# Patient Record
Sex: Female | Born: 1950 | Race: White | Hispanic: No | Marital: Single | Smoking: Current every day smoker
Health system: Southern US, Community
[De-identification: ages and names within clinical notes are randomized; demographics above are authoritative.]

## PROBLEM LIST (undated history)

## (undated) DIAGNOSIS — G2581 Restless legs syndrome: Secondary | ICD-10-CM

## (undated) DIAGNOSIS — F32A Depression, unspecified: Secondary | ICD-10-CM

## (undated) DIAGNOSIS — F988 Other specified behavioral and emotional disorders with onset usually occurring in childhood and adolescence: Secondary | ICD-10-CM

## (undated) DIAGNOSIS — Z720 Tobacco use: Secondary | ICD-10-CM

## (undated) DIAGNOSIS — E079 Disorder of thyroid, unspecified: Secondary | ICD-10-CM

## (undated) DIAGNOSIS — F329 Major depressive disorder, single episode, unspecified: Secondary | ICD-10-CM

## (undated) DIAGNOSIS — F419 Anxiety disorder, unspecified: Secondary | ICD-10-CM

## (undated) DIAGNOSIS — I10 Essential (primary) hypertension: Secondary | ICD-10-CM

## (undated) HISTORY — PX: KNEE SURGERY: SHX244

## (undated) HISTORY — PX: WRIST SURGERY: SHX841

---

## 1998-08-27 ENCOUNTER — Emergency Department (HOSPITAL_COMMUNITY): Admission: EM | Admit: 1998-08-27 | Discharge: 1998-08-27 | Payer: Self-pay | Admitting: Emergency Medicine

## 1998-11-11 ENCOUNTER — Encounter: Payer: Self-pay | Admitting: Emergency Medicine

## 1998-11-11 ENCOUNTER — Encounter: Payer: Self-pay | Admitting: Urology

## 1998-11-11 ENCOUNTER — Ambulatory Visit (HOSPITAL_COMMUNITY): Admission: EM | Admit: 1998-11-11 | Discharge: 1998-11-12 | Payer: Self-pay | Admitting: Emergency Medicine

## 1998-11-16 ENCOUNTER — Emergency Department (HOSPITAL_COMMUNITY): Admission: EM | Admit: 1998-11-16 | Discharge: 1998-11-16 | Payer: Self-pay | Admitting: Emergency Medicine

## 1998-11-16 ENCOUNTER — Encounter: Payer: Self-pay | Admitting: Urology

## 1998-11-26 ENCOUNTER — Encounter: Payer: Self-pay | Admitting: Urology

## 1998-11-26 ENCOUNTER — Ambulatory Visit (HOSPITAL_COMMUNITY): Admission: RE | Admit: 1998-11-26 | Discharge: 1998-11-26 | Payer: Self-pay | Admitting: Urology

## 1999-09-29 ENCOUNTER — Encounter: Admission: RE | Admit: 1999-09-29 | Discharge: 1999-12-28 | Payer: Self-pay | Admitting: Anesthesiology

## 2002-09-17 ENCOUNTER — Emergency Department (HOSPITAL_COMMUNITY): Admission: EM | Admit: 2002-09-17 | Discharge: 2002-09-17 | Payer: Self-pay | Admitting: Emergency Medicine

## 2002-09-17 ENCOUNTER — Encounter: Payer: Self-pay | Admitting: Emergency Medicine

## 2006-03-23 ENCOUNTER — Encounter: Payer: Self-pay | Admitting: Emergency Medicine

## 2006-09-09 ENCOUNTER — Emergency Department (HOSPITAL_COMMUNITY): Admission: EM | Admit: 2006-09-09 | Discharge: 2006-09-09 | Payer: Self-pay | Admitting: Emergency Medicine

## 2009-08-28 ENCOUNTER — Inpatient Hospital Stay (HOSPITAL_COMMUNITY): Admission: EM | Admit: 2009-08-28 | Discharge: 2009-08-30 | Payer: Self-pay | Admitting: Emergency Medicine

## 2009-09-11 ENCOUNTER — Encounter: Admission: RE | Admit: 2009-09-11 | Discharge: 2009-11-12 | Payer: Self-pay | Admitting: Orthopedic Surgery

## 2009-10-09 ENCOUNTER — Ambulatory Visit (HOSPITAL_BASED_OUTPATIENT_CLINIC_OR_DEPARTMENT_OTHER): Admission: RE | Admit: 2009-10-09 | Discharge: 2009-10-09 | Payer: Self-pay | Admitting: Orthopedic Surgery

## 2011-02-24 LAB — POCT HEMOGLOBIN-HEMACUE: Hemoglobin: 15.9 g/dL — ABNORMAL HIGH (ref 12.0–15.0)

## 2011-02-25 LAB — POCT I-STAT, CHEM 8
Calcium, Ion: 1.13 mmol/L (ref 1.12–1.32)
Glucose, Bld: 96 mg/dL (ref 70–99)
HCT: 41 % (ref 36.0–46.0)
Hemoglobin: 13.9 g/dL (ref 12.0–15.0)
Potassium: 4 mEq/L (ref 3.5–5.1)
Sodium: 141 mEq/L (ref 135–145)

## 2011-02-25 LAB — CBC
HCT: 38.4 % (ref 36.0–46.0)
Hemoglobin: 13.2 g/dL (ref 12.0–15.0)
MCV: 98.7 fL (ref 78.0–100.0)
Platelets: 183 10*3/uL (ref 150–400)
RBC: 3.89 MIL/uL (ref 3.87–5.11)
RDW: 13.2 % (ref 11.5–15.5)
WBC: 5.4 10*3/uL (ref 4.0–10.5)

## 2011-02-25 LAB — DIFFERENTIAL
Monocytes Absolute: 0.4 10*3/uL (ref 0.1–1.0)
Monocytes Relative: 7 % (ref 3–12)
Neutrophils Relative %: 66 % (ref 43–77)

## 2011-02-25 LAB — APTT: aPTT: 26 seconds (ref 24–37)

## 2017-02-14 ENCOUNTER — Emergency Department (HOSPITAL_COMMUNITY)
Admission: EM | Admit: 2017-02-14 | Discharge: 2017-02-14 | Disposition: A | Discharge status: Home / Self Care | Payer: Medicare Other | Attending: Dermatology | Admitting: Dermatology

## 2017-02-14 ENCOUNTER — Encounter (HOSPITAL_COMMUNITY): Payer: Self-pay | Admitting: Emergency Medicine

## 2017-02-14 DIAGNOSIS — Y929 Unspecified place or not applicable: Secondary | ICD-10-CM | POA: Insufficient documentation

## 2017-02-14 DIAGNOSIS — Y939 Activity, unspecified: Secondary | ICD-10-CM | POA: Insufficient documentation

## 2017-02-14 DIAGNOSIS — S0990XA Unspecified injury of head, initial encounter: Secondary | ICD-10-CM | POA: Insufficient documentation

## 2017-02-14 DIAGNOSIS — W1839XA Other fall on same level, initial encounter: Secondary | ICD-10-CM | POA: Insufficient documentation

## 2017-02-14 DIAGNOSIS — Y999 Unspecified external cause status: Secondary | ICD-10-CM | POA: Diagnosis not present

## 2017-02-14 DIAGNOSIS — Z5321 Procedure and treatment not carried out due to patient leaving prior to being seen by health care provider: Secondary | ICD-10-CM | POA: Diagnosis not present

## 2017-02-14 HISTORY — DX: Major depressive disorder, single episode, unspecified: F32.9

## 2017-02-14 HISTORY — DX: Anxiety disorder, unspecified: F41.9

## 2017-02-14 HISTORY — DX: Disorder of thyroid, unspecified: E07.9

## 2017-02-14 HISTORY — DX: Other specified behavioral and emotional disorders with onset usually occurring in childhood and adolescence: F98.8

## 2017-02-14 HISTORY — DX: Depression, unspecified: F32.A

## 2017-02-14 NOTE — ED Notes (Addendum)
Pt states that she doesn't want to be evaluated any more.  The risk of pt leaving prior to being checked out were explained to pt. Pt expressed understanding. Pt signed AMA and ambulated out of department without any difficulties or an unsteady gait.  Gertie Fey, PA made aware.

## 2017-02-14 NOTE — ED Triage Notes (Signed)
Per pt, states she was leaving a doctor's office and stepped on a parking block which was loose-fell hitting right side of head-abrasions to both knees-left hand, and lac above right eye

## 2018-09-19 ENCOUNTER — Encounter (HOSPITAL_COMMUNITY): Payer: Self-pay

## 2018-09-19 ENCOUNTER — Other Ambulatory Visit: Payer: Self-pay

## 2018-09-19 ENCOUNTER — Inpatient Hospital Stay (HOSPITAL_COMMUNITY)
Admission: EM | Admit: 2018-09-19 | Discharge: 2018-09-22 | DRG: 597 | Disposition: A | Discharge status: Home Health | Payer: Medicare Other | Attending: Internal Medicine | Admitting: Internal Medicine

## 2018-09-19 DIAGNOSIS — R531 Weakness: Secondary | ICD-10-CM

## 2018-09-19 DIAGNOSIS — C50911 Malignant neoplasm of unspecified site of right female breast: Secondary | ICD-10-CM | POA: Diagnosis not present

## 2018-09-19 DIAGNOSIS — F418 Other specified anxiety disorders: Secondary | ICD-10-CM | POA: Diagnosis present

## 2018-09-19 DIAGNOSIS — E860 Dehydration: Secondary | ICD-10-CM | POA: Diagnosis present

## 2018-09-19 DIAGNOSIS — F329 Major depressive disorder, single episode, unspecified: Secondary | ICD-10-CM | POA: Diagnosis present

## 2018-09-19 DIAGNOSIS — R160 Hepatomegaly, not elsewhere classified: Secondary | ICD-10-CM

## 2018-09-19 DIAGNOSIS — F419 Anxiety disorder, unspecified: Secondary | ICD-10-CM | POA: Diagnosis present

## 2018-09-19 DIAGNOSIS — C787 Secondary malignant neoplasm of liver and intrahepatic bile duct: Secondary | ICD-10-CM | POA: Diagnosis present

## 2018-09-19 DIAGNOSIS — G9341 Metabolic encephalopathy: Secondary | ICD-10-CM | POA: Diagnosis present

## 2018-09-19 DIAGNOSIS — K219 Gastro-esophageal reflux disease without esophagitis: Secondary | ICD-10-CM | POA: Diagnosis present

## 2018-09-19 DIAGNOSIS — K5909 Other constipation: Secondary | ICD-10-CM | POA: Diagnosis present

## 2018-09-19 DIAGNOSIS — Z881 Allergy status to other antibiotic agents status: Secondary | ICD-10-CM

## 2018-09-19 DIAGNOSIS — R627 Adult failure to thrive: Secondary | ICD-10-CM | POA: Diagnosis present

## 2018-09-19 DIAGNOSIS — F17200 Nicotine dependence, unspecified, uncomplicated: Secondary | ICD-10-CM | POA: Diagnosis present

## 2018-09-19 DIAGNOSIS — Z720 Tobacco use: Secondary | ICD-10-CM

## 2018-09-19 DIAGNOSIS — K5730 Diverticulosis of large intestine without perforation or abscess without bleeding: Secondary | ICD-10-CM | POA: Diagnosis present

## 2018-09-19 DIAGNOSIS — Z79899 Other long term (current) drug therapy: Secondary | ICD-10-CM

## 2018-09-19 DIAGNOSIS — G2581 Restless legs syndrome: Secondary | ICD-10-CM | POA: Diagnosis present

## 2018-09-19 DIAGNOSIS — K449 Diaphragmatic hernia without obstruction or gangrene: Secondary | ICD-10-CM | POA: Diagnosis present

## 2018-09-19 DIAGNOSIS — Z681 Body mass index (BMI) 19 or less, adult: Secondary | ICD-10-CM

## 2018-09-19 DIAGNOSIS — E039 Hypothyroidism, unspecified: Secondary | ICD-10-CM | POA: Diagnosis present

## 2018-09-19 DIAGNOSIS — C50919 Malignant neoplasm of unspecified site of unspecified female breast: Secondary | ICD-10-CM

## 2018-09-19 DIAGNOSIS — I10 Essential (primary) hypertension: Secondary | ICD-10-CM | POA: Diagnosis present

## 2018-09-19 DIAGNOSIS — I951 Orthostatic hypotension: Secondary | ICD-10-CM | POA: Diagnosis present

## 2018-09-19 DIAGNOSIS — N183 Chronic kidney disease, stage 3 unspecified: Secondary | ICD-10-CM | POA: Diagnosis present

## 2018-09-19 DIAGNOSIS — Z882 Allergy status to sulfonamides status: Secondary | ICD-10-CM

## 2018-09-19 DIAGNOSIS — R7989 Other specified abnormal findings of blood chemistry: Secondary | ICD-10-CM | POA: Diagnosis present

## 2018-09-19 DIAGNOSIS — G893 Neoplasm related pain (acute) (chronic): Secondary | ICD-10-CM | POA: Diagnosis present

## 2018-09-19 DIAGNOSIS — Z8673 Personal history of transient ischemic attack (TIA), and cerebral infarction without residual deficits: Secondary | ICD-10-CM

## 2018-09-19 DIAGNOSIS — F988 Other specified behavioral and emotional disorders with onset usually occurring in childhood and adolescence: Secondary | ICD-10-CM | POA: Diagnosis present

## 2018-09-19 DIAGNOSIS — Z800 Family history of malignant neoplasm of digestive organs: Secondary | ICD-10-CM

## 2018-09-19 DIAGNOSIS — Z79890 Hormone replacement therapy (postmenopausal): Secondary | ICD-10-CM

## 2018-09-19 DIAGNOSIS — N179 Acute kidney failure, unspecified: Secondary | ICD-10-CM

## 2018-09-19 DIAGNOSIS — I129 Hypertensive chronic kidney disease with stage 1 through stage 4 chronic kidney disease, or unspecified chronic kidney disease: Secondary | ICD-10-CM | POA: Diagnosis present

## 2018-09-19 DIAGNOSIS — R945 Abnormal results of liver function studies: Secondary | ICD-10-CM | POA: Diagnosis present

## 2018-09-19 DIAGNOSIS — R634 Abnormal weight loss: Secondary | ICD-10-CM

## 2018-09-19 DIAGNOSIS — K769 Liver disease, unspecified: Secondary | ICD-10-CM

## 2018-09-19 HISTORY — DX: Essential (primary) hypertension: I10

## 2018-09-19 HISTORY — DX: Restless legs syndrome: G25.81

## 2018-09-19 HISTORY — DX: Tobacco use: Z72.0

## 2018-09-19 LAB — BASIC METABOLIC PANEL
Anion gap: 11 (ref 5–15)
BUN: 33 mg/dL — AB (ref 8–23)
CALCIUM: 10.7 mg/dL — AB (ref 8.9–10.3)
CO2: 21 mmol/L — ABNORMAL LOW (ref 22–32)
CREATININE: 2.15 mg/dL — AB (ref 0.44–1.00)
Chloride: 104 mmol/L (ref 98–111)
GFR calc non Af Amer: 22 mL/min — ABNORMAL LOW (ref >60)
GFR, EST AFRICAN AMERICAN: 26 mL/min — AB (ref >60)
Glucose, Bld: 103 mg/dL — ABNORMAL HIGH (ref 70–99)
Potassium: 4.6 mmol/L (ref 3.5–5.1)
SODIUM: 136 mmol/L (ref 135–145)

## 2018-09-19 LAB — CBC
HCT: 45.8 % (ref 36.0–46.0)
Hemoglobin: 14.1 g/dL (ref 12.0–15.0)
MCH: 30.3 pg (ref 26.0–34.0)
MCHC: 30.8 g/dL (ref 30.0–36.0)
MCV: 98.3 fL (ref 80.0–100.0)
NRBC: 0 % (ref 0.0–0.2)
PLATELETS: 205 10*3/uL (ref 150–400)
RBC: 4.66 MIL/uL (ref 3.87–5.11)
RDW: 13.8 % (ref 11.5–15.5)
WBC: 7.1 10*3/uL (ref 4.0–10.5)

## 2018-09-19 NOTE — ED Triage Notes (Signed)
V/S rechecked

## 2018-09-19 NOTE — ED Triage Notes (Signed)
Pt reports that she has not been able to get proper BP reading, and PCP stated that pt liver enzymes were abnormal and was sent to ED for evaluation. Pt is hypotn at this time in triage. Pt does report having increased weakness. Pt escorted with friend.

## 2018-09-20 ENCOUNTER — Observation Stay (HOSPITAL_COMMUNITY): Payer: Medicare Other

## 2018-09-20 ENCOUNTER — Emergency Department (HOSPITAL_COMMUNITY): Payer: Medicare Other

## 2018-09-20 ENCOUNTER — Encounter (HOSPITAL_COMMUNITY): Payer: Self-pay

## 2018-09-20 DIAGNOSIS — E860 Dehydration: Secondary | ICD-10-CM | POA: Diagnosis not present

## 2018-09-20 DIAGNOSIS — R945 Abnormal results of liver function studies: Secondary | ICD-10-CM

## 2018-09-20 DIAGNOSIS — N183 Chronic kidney disease, stage 3 unspecified: Secondary | ICD-10-CM | POA: Diagnosis present

## 2018-09-20 DIAGNOSIS — F418 Other specified anxiety disorders: Secondary | ICD-10-CM

## 2018-09-20 DIAGNOSIS — E039 Hypothyroidism, unspecified: Secondary | ICD-10-CM | POA: Diagnosis present

## 2018-09-20 DIAGNOSIS — N179 Acute kidney failure, unspecified: Secondary | ICD-10-CM | POA: Diagnosis present

## 2018-09-20 DIAGNOSIS — I10 Essential (primary) hypertension: Secondary | ICD-10-CM

## 2018-09-20 DIAGNOSIS — R160 Hepatomegaly, not elsewhere classified: Secondary | ICD-10-CM

## 2018-09-20 DIAGNOSIS — R7989 Other specified abnormal findings of blood chemistry: Secondary | ICD-10-CM | POA: Diagnosis present

## 2018-09-20 DIAGNOSIS — C799 Secondary malignant neoplasm of unspecified site: Secondary | ICD-10-CM

## 2018-09-20 DIAGNOSIS — F988 Other specified behavioral and emotional disorders with onset usually occurring in childhood and adolescence: Secondary | ICD-10-CM | POA: Diagnosis present

## 2018-09-20 DIAGNOSIS — Z720 Tobacco use: Secondary | ICD-10-CM | POA: Diagnosis present

## 2018-09-20 DIAGNOSIS — F909 Attention-deficit hyperactivity disorder, unspecified type: Secondary | ICD-10-CM | POA: Diagnosis not present

## 2018-09-20 DIAGNOSIS — K219 Gastro-esophageal reflux disease without esophagitis: Secondary | ICD-10-CM | POA: Diagnosis present

## 2018-09-20 LAB — CBG MONITORING, ED: GLUCOSE-CAPILLARY: 90 mg/dL (ref 70–99)

## 2018-09-20 LAB — HEPATIC FUNCTION PANEL
ALT: 68 U/L — AB (ref 0–44)
AST: 159 U/L — ABNORMAL HIGH (ref 15–41)
Albumin: 3.2 g/dL — ABNORMAL LOW (ref 3.5–5.0)
Alkaline Phosphatase: 353 U/L — ABNORMAL HIGH (ref 38–126)
BILIRUBIN DIRECT: 0.5 mg/dL — AB (ref 0.0–0.2)
BILIRUBIN INDIRECT: 0.3 mg/dL (ref 0.3–0.9)
Total Bilirubin: 0.8 mg/dL (ref 0.3–1.2)
Total Protein: 7.1 g/dL (ref 6.5–8.1)

## 2018-09-20 LAB — TSH: TSH: 4.858 u[IU]/mL — ABNORMAL HIGH (ref 0.350–4.500)

## 2018-09-20 LAB — BASIC METABOLIC PANEL
ANION GAP: 7 (ref 5–15)
BUN: 36 mg/dL — ABNORMAL HIGH (ref 8–23)
CALCIUM: 9.9 mg/dL (ref 8.9–10.3)
CO2: 21 mmol/L — ABNORMAL LOW (ref 22–32)
Chloride: 106 mmol/L (ref 98–111)
Creatinine, Ser: 2.17 mg/dL — ABNORMAL HIGH (ref 0.44–1.00)
GFR, EST AFRICAN AMERICAN: 26 mL/min — AB (ref >60)
GFR, EST NON AFRICAN AMERICAN: 22 mL/min — AB (ref >60)
Glucose, Bld: 111 mg/dL — ABNORMAL HIGH (ref 70–99)
Potassium: 4.2 mmol/L (ref 3.5–5.1)
Sodium: 134 mmol/L — ABNORMAL LOW (ref 135–145)

## 2018-09-20 LAB — URINALYSIS, ROUTINE W REFLEX MICROSCOPIC
Glucose, UA: NEGATIVE mg/dL
HGB URINE DIPSTICK: NEGATIVE
Ketones, ur: NEGATIVE mg/dL
Nitrite: NEGATIVE
PROTEIN: 30 mg/dL — AB
Specific Gravity, Urine: 1.017 (ref 1.005–1.030)
pH: 5 (ref 5.0–8.0)

## 2018-09-20 LAB — CBC
HCT: 40.7 % (ref 36.0–46.0)
Hemoglobin: 12.5 g/dL (ref 12.0–15.0)
MCH: 29.6 pg (ref 26.0–34.0)
MCHC: 30.7 g/dL (ref 30.0–36.0)
MCV: 96.4 fL (ref 80.0–100.0)
NRBC: 0 % (ref 0.0–0.2)
PLATELETS: 170 10*3/uL (ref 150–400)
RBC: 4.22 MIL/uL (ref 3.87–5.11)
RDW: 13.8 % (ref 11.5–15.5)
WBC: 6.7 10*3/uL (ref 4.0–10.5)

## 2018-09-20 LAB — CREATININE, URINE, RANDOM: CREATININE, URINE: 245.7 mg/dL

## 2018-09-20 LAB — APTT: aPTT: 28 seconds (ref 24–36)

## 2018-09-20 LAB — RAPID URINE DRUG SCREEN, HOSP PERFORMED
AMPHETAMINES: POSITIVE — AB
BENZODIAZEPINES: POSITIVE — AB
Barbiturates: NOT DETECTED
Cocaine: NOT DETECTED
OPIATES: POSITIVE — AB
Tetrahydrocannabinol: NOT DETECTED

## 2018-09-20 LAB — PROTIME-INR
INR: 1.2
PROTHROMBIN TIME: 15.1 s (ref 11.4–15.2)

## 2018-09-20 LAB — HIV ANTIBODY (ROUTINE TESTING W REFLEX): HIV SCREEN 4TH GENERATION: NONREACTIVE

## 2018-09-20 LAB — SODIUM, URINE, RANDOM: SODIUM UR: 25 mmol/L

## 2018-09-20 LAB — LIPASE, BLOOD: Lipase: 37 U/L (ref 11–51)

## 2018-09-20 MED ORDER — SODIUM CHLORIDE 0.9 % IV BOLUS
1000.0000 mL | Freq: Once | INTRAVENOUS | Status: AC
  Administered 2018-09-20: 1000 mL via INTRAVENOUS

## 2018-09-20 MED ORDER — OXYCODONE HCL 5 MG PO TABS
5.0000 mg | ORAL_TABLET | Freq: Four times a day (QID) | ORAL | Status: DC | PRN
  Administered 2018-09-20 – 2018-09-22 (×3): 5 mg via ORAL
  Filled 2018-09-20 (×4): qty 1

## 2018-09-20 MED ORDER — MORPHINE SULFATE (PF) 4 MG/ML IV SOLN
4.0000 mg | Freq: Once | INTRAVENOUS | Status: AC
  Administered 2018-09-20: 4 mg via INTRAVENOUS
  Filled 2018-09-20: qty 1

## 2018-09-20 MED ORDER — NICOTINE 21 MG/24HR TD PT24
21.0000 mg | MEDICATED_PATCH | Freq: Every day | TRANSDERMAL | Status: DC
  Administered 2018-09-20 – 2018-09-21 (×2): 21 mg via TRANSDERMAL
  Filled 2018-09-20 (×3): qty 1

## 2018-09-20 MED ORDER — AMPHETAMINE-DEXTROAMPHETAMINE 10 MG PO TABS
30.0000 mg | ORAL_TABLET | Freq: Two times a day (BID) | ORAL | Status: DC
  Administered 2018-09-20 – 2018-09-22 (×3): 30 mg via ORAL
  Filled 2018-09-20 (×3): qty 3

## 2018-09-20 MED ORDER — SODIUM CHLORIDE 0.9 % IV SOLN
INTRAVENOUS | Status: DC
  Administered 2018-09-20 – 2018-09-22 (×4): via INTRAVENOUS

## 2018-09-20 MED ORDER — ALPRAZOLAM 0.5 MG PO TABS
0.5000 mg | ORAL_TABLET | Freq: Two times a day (BID) | ORAL | Status: DC
  Administered 2018-09-20 – 2018-09-22 (×4): 0.5 mg via ORAL
  Filled 2018-09-20 (×4): qty 1

## 2018-09-20 MED ORDER — SENNOSIDES-DOCUSATE SODIUM 8.6-50 MG PO TABS: 1.0000 | ORAL_TABLET | Freq: Every evening | ORAL | Status: DC | PRN

## 2018-09-20 MED ORDER — ZOLPIDEM TARTRATE 5 MG PO TABS: 5.0000 mg | ORAL_TABLET | Freq: Every evening | ORAL | Status: DC | PRN

## 2018-09-20 MED ORDER — PRAMIPEXOLE DIHYDROCHLORIDE 0.125 MG PO TABS
0.1250 mg | ORAL_TABLET | Freq: Every day | ORAL | Status: DC
  Administered 2018-09-20: 0.125 mg via ORAL
  Filled 2018-09-20 (×2): qty 1

## 2018-09-20 MED ORDER — ONDANSETRON HCL 4 MG/2ML IJ SOLN: 4.0000 mg | Freq: Four times a day (QID) | INTRAMUSCULAR | Status: DC | PRN

## 2018-09-20 MED ORDER — IOHEXOL 300 MG/ML  SOLN
30.0000 mL | Freq: Once | INTRAMUSCULAR | Status: AC | PRN
  Administered 2018-09-20: 30 mL via ORAL

## 2018-09-20 MED ORDER — SODIUM CHLORIDE 0.9 % IV SOLN
INTRAVENOUS | Status: DC
  Administered 2018-09-20: 07:00:00 via INTRAVENOUS

## 2018-09-20 MED ORDER — ONDANSETRON HCL 4 MG PO TABS: 4.0000 mg | ORAL_TABLET | Freq: Four times a day (QID) | ORAL | Status: DC | PRN

## 2018-09-20 MED ORDER — PAROXETINE HCL 20 MG PO TABS
40.0000 mg | ORAL_TABLET | Freq: Every day | ORAL | Status: DC
  Administered 2018-09-20 – 2018-09-22 (×3): 40 mg via ORAL
  Filled 2018-09-20 (×3): qty 2

## 2018-09-20 MED ORDER — LEVOTHYROXINE SODIUM 25 MCG PO TABS
137.0000 ug | ORAL_TABLET | Freq: Every day | ORAL | Status: DC
  Administered 2018-09-20 – 2018-09-22 (×3): 137 ug via ORAL
  Filled 2018-09-20 (×3): qty 1

## 2018-09-20 MED ORDER — FAMOTIDINE 20 MG PO TABS
20.0000 mg | ORAL_TABLET | Freq: Every day | ORAL | Status: DC
  Administered 2018-09-20 – 2018-09-22 (×3): 20 mg via ORAL
  Filled 2018-09-20 (×3): qty 1

## 2018-09-20 MED ORDER — AMLODIPINE BESYLATE 5 MG PO TABS
5.0000 mg | ORAL_TABLET | Freq: Every day | ORAL | Status: DC
  Administered 2018-09-20: 5 mg via ORAL
  Filled 2018-09-20: qty 1

## 2018-09-20 MED ORDER — HYDRALAZINE HCL 20 MG/ML IJ SOLN
5.0000 mg | INTRAMUSCULAR | Status: DC | PRN
  Filled 2018-09-20: qty 0.25

## 2018-09-20 NOTE — ED Notes (Addendum)
Pt became confused and began taking all her cardiac leads off as well as pulse ox off. Pt informed that we still need those on so we can monitor her and pt verbalized understanding. This RN went back in the room 10 minutes later to the pt having completely removed all equipment as well as IV. Pt stated "I thought they said I could". New IV placed (22G left forearm). Pt's friend at bedside

## 2018-09-20 NOTE — ED Notes (Signed)
Admitting MD at bedside.

## 2018-09-20 NOTE — Progress Notes (Addendum)
PROGRESS NOTE  Cheyenne Mckenzie HTD:[id number] DOB: October 07, 1951 DOA: 09/19/2018 PCP: Stephens Shire, MD  Brief Summary:  Progressive weakness, drowsiness, weight loss,    HPI/Recap of past 24 hours:  She is drowsy, she denies pain No fever  Assessment/Plan: Principal Problem:   Acute renal failure superimposed on stage 3 chronic kidney disease ([place]) Active Problems:   ADD (attention deficit disorder)   Depression with anxiety   Hypothyroidism   Abnormal LFTs   Lesion of liver   Hypercalcemia   Essential hypertension   GERD (gastroesophageal reflux disease)   Tobacco abuse  Metastatic cancer -per sister patient 's mother died of fibrosarcoma and father died of colon cancer in the 1990's -case discussed with oncology Dr Irene Limbo who recommended ct chest which showed right sided breast mass -likely breast cancer with liver mets, will proceed with right breast mass biopsy -will follow oncology recommendations  Encephalopathy: -patient has been drowsy, not able to stay awake during conversation, per family patient has been drowsy for about 2 wks, only new meds is norvasc, patient has been on xanax since the late 1990's -continue hydration, will get uds, will get ammonia level and mri brain   Poor oral intake, Dehydration, orthostatic hypotension: D/c norvasc, continue ivf,   AKI vs progressive ckd:  Last cr  0.6 in 2010 Cr 2.15 on presentation Ct ab: Mild bilateral renal parenchyma atrophy, nonobstructing 39mm right sided kidneystone , no obstructive nephropathy Continue hydration, repeat bmp in am Renal dosing meds  Psych: has been on xanax/adderall/paxil since 1990's, no recent changes  Hypothyroidism: continue on synthroid  FTT: family reports patient lives with a friend for 79yrs, friend can stay with her 24/7, will likely need home health will get PT eval  Code Status: full  Family Communication: patient , friend [name] over the phone, sister over the  phone  Disposition Plan: not ready to discharge   Consultants:  oncology  Procedures:  Plan for US guided breast mass biopsy  Antibiotics:  none   Objective: BP (!) 126/92   Pulse 77   Temp 97.8 F (36.6 C) (Oral)   Resp 17   Ht 5\' 8"  (1.727 m)   Wt 59 kg   SpO2 98%   BMI 19.77 kg/m  No intake or output data in the 24 hours ending 09/20/18 1445 Filed Weights   09/19/18 1931  Weight: 59 kg    Exam: Patient is examined daily including today on 09/20/2018, exams remain the same as of yesterday except that has changed    General:  Drowsy, not oriented  Cardiovascular: RRR  Respiratory: CTABL  Abdomen: Soft/ND/NT, positive BS  Musculoskeletal: No Edema  Neuro: Drowsy, not oriented  Data Reviewed: Basic Metabolic Panel: Recent Labs  Lab 09/19/18 1948 09/20/18 0502  NA 136 134*  K 4.6 4.2  CL 104 106  CO2 21* 21*  GLUCOSE 103* 111*  BUN 33* 36*  CREATININE 2.15* 2.17*  CALCIUM 10.7* 9.9   Liver Function Tests: Recent Labs  Lab 09/19/18 1948  AST 159*  ALT 68*  ALKPHOS 353*  BILITOT 0.8  PROT 7.1  ALBUMIN 3.2*   Recent Labs  Lab 09/20/18 0418  LIPASE 37   No results for input(s): AMMONIA in the last 168 hours. CBC: Recent Labs  Lab 09/19/18 1948 09/20/18 0502  WBC 7.1 6.7  HGB 14.1 12.5  HCT 45.8 40.7  MCV 98.3 96.4  PLT 205 170   Cardiac Enzymes:   No results for input(s): CKTOTAL,  CKMB, CKMBINDEX, TROPONINI in the last 168 hours. BNP (last 3 results) No results for input(s): BNP in the last 8760 hours.  ProBNP (last 3 results) No results for input(s): PROBNP in the last 8760 hours.  CBG: Recent Labs  Lab 09/20/18 0009  GLUCAP 90    No results found for this or any previous visit (from the past 240 hour(s)).   Studies: Ct Abdomen Pelvis Wo Contrast  Result Date: 09/20/2018 CLINICAL DATA:  67 year old female with abdominal pain. Abnormal liver enzymes. EXAM: CT ABDOMEN AND PELVIS WITHOUT CONTRAST TECHNIQUE:  Multidetector CT imaging of the abdomen and pelvis was performed following the standard protocol without IV contrast. COMPARISON:  None. FINDINGS: Evaluation of this exam is limited in the absence of intravenous contrast. Lower chest: There is a 5 mm right lower lobe pulmonary nodule. The visualized lung bases are otherwise clear. There is coronary vascular calcification. No intra-abdominal free air or free fluid. Hepatobiliary: Multiple (greater than 20) hepatic hypodense lesions most consistent with metastatic disease. Clinical correlation and further evaluation with MRI or tissue sampling recommended. No intrahepatic biliary ductal dilatation. The gallbladder is unremarkable. Pancreas: Unremarkable. No pancreatic ductal dilatation or surrounding inflammatory changes. Spleen: Normal in size without focal abnormality. Adrenals/Urinary Tract: The adrenal glands are unremarkable. Mild bilateral renal parenchyma atrophy. A 4 mm vascular calcification versus less likely a nonobstructing right renal interpolar calculus. No hydronephrosis. There is no hydronephrosis or nephrolithiasis on the left. The visualized ureters and urinary bladder appear unremarkable. Stomach/Bowel: There is moderate stool within the colon. There is colonic diverticulosis without active inflammatory changes. There is a moderate size hiatal hernia. There is no bowel obstruction or active inflammation. Normal appendix. Vascular/Lymphatic: Moderate aortoiliac atherosclerotic disease. No portal venous gas. There is no adenopathy. Reproductive: The uterus is poorly visualized and may be atrophic or partially resected. No pelvic mass. Other: None Musculoskeletal: Osteopenia with scoliosis and degenerative changes of the spine. No acute osseous pathology. IMPRESSION: 1. Multiple hepatic hypodense lesions most consistent with metastatic disease. Clinical correlation and further evaluation with MRI or tissue sampling recommended. 2. Colonic  diverticulosis. No bowel obstruction or active inflammation. Normal appendix. 3. Moderate size hiatal hernia. Electronically Signed   By: Anner Crete M.D.   On: 09/20/2018 03:00   Dg Chest 2 View  Result Date: 09/20/2018 CLINICAL DATA:  67 year old female with weakness. EXAM: CHEST - 2 VIEW COMPARISON:  Chest radiograph dated 08/28/2009 FINDINGS: Mild eventration of the left hemidiaphragm with left lung base atelectasis. No focal consolidation, pleural effusion, or pneumothorax. Stable cardiac silhouette. Thoracic scoliosis. No acute osseous pathology. IMPRESSION: No active cardiopulmonary disease. Electronically Signed   By: Anner Crete M.D.   On: 09/20/2018 04:15   Ct Chest Wo Contrast  Result Date: 09/20/2018 CLINICAL DATA:  Patient with known hepatic metastatic disease, unknown primary EXAM: CT CHEST WITHOUT CONTRAST TECHNIQUE: Multidetector CT imaging of the chest was performed following the standard protocol without IV contrast. COMPARISON:  Abdominal CT from earlier in the same day. FINDINGS: Cardiovascular: Limited due to lack of IV contrast. Diffuse atherosclerotic changes are identified. Tortuosity of the thoracic aorta is noted. Prominence of the ascending aorta 3.8 cm is noted. Coronary calcifications are seen. No significant cardiac enlargement is noted. Mediastinum/Nodes: Esophagus is within normal limits. The thoracic inlet is unremarkable. No significant hilar or mediastinal adenopathy is identified. A 10 mm lymph node is noted along the right inferior axilla as well as multiple lymph nodes within the right axilla. The largest of these measures  approximately 10.7 mm in short axis. These are trouble some based on their multiplicity and asymmetry. Lungs/Pleura: Diffuse emphysematous changes are noted. A 5 mm right lower lobe nodule is noted best seen on image number 79 of series 5. Mild scarring is noted in the left lung base. No focal infiltrate or effusion is seen. An ill-defined  6 mm nodule is noted within the left lower lobe. No other focal nodules are seen. No pneumothorax or effusion is noted. Upper Abdomen: Visualized upper abdomen again demonstrates multiple hypodense lesions scattered throughout the liver consistent with metastatic disease. Musculoskeletal: Degenerative changes of the thoracic spine are noted with associated scoliosis. No definitive rib abnormality is seen at this time. In the right breast in a retroareolar location, there is a 3.9 x 3.2 cm somewhat spiculated mass lesion with associated calcifications identified. This is highly suspicious for breast carcinoma given its spiculated appearance as well as the apparent nipple retraction. Correlation with the physical exam is recommended. Mammography may be helpful as well with possible breast biopsy. IMPRESSION: Right breast mass with spiculation as described above. Associated axillary adenopathy is identified. These changes are seen consistent with a primary breast carcinoma till proven otherwise. Mammography and ultrasound evaluation may be helpful. Lung nodules in the lower lobes bilaterally. These are of uncertain significance and may be unrelated to the underlying breast abnormality and liver abnormality. Stable hepatic metastatic disease. Mildly prominent ascending thoracic aorta. Aortic Atherosclerosis (ICD10-I70.0) and Emphysema (ICD10-J43.9). Electronically Signed   By: Inez Catalina M.D.   On: 09/20/2018 13:53    Scheduled Meds: . ALPRAZolam  0.5 mg Oral BID  . amLODipine  5 mg Oral Daily  . amphetamine-dextroamphetamine  30 mg Oral BID  . famotidine  20 mg Oral Daily  . levothyroxine  137 mcg Oral Q0600  . nicotine  21 mg Transdermal Daily  . PARoxetine  40 mg Oral Daily  . pramipexole  0.125 mg Oral QHS    Continuous Infusions: . sodium chloride       Time spent: 35 mins, case discussed with oncology and family I have personally reviewed and interpreted on  09/20/2018 daily labs, imagings  as discussed above under date review session and assessment and plans.  I reviewed all nursing notes, pharmacy notes, consultant notes,  vitals, pertinent old records  I have discussed plan of care as described above with RN , patient and family on 09/20/2018   Florencia Reasons MD, PhD  [facility] Pager [phone]. If 7PM-7AM, please contact night-coverage at [email or web address], password [facility] 09/20/2018, 2:45 PM  LOS: 0 days

## 2018-09-20 NOTE — ED Notes (Signed)
Bed: UO15 Expected date:  Expected time:  Means of arrival:  Comments: 56

## 2018-09-20 NOTE — Progress Notes (Signed)
Oncology short note  Patient was discussed with Dr Erlinda Hong. CHart reviewed . Findings appear to suggest metastatic breast carcinoma. Will need MMG/diagnostic tomo and [place] with biopsy of breast mass and Lymphadenopathy for diagnosis. I discussed case with Dr Lindi Adie - who has kindly agreed to see this patient today/tommorrow.  Cheyenne Lone  MD MS

## 2018-09-20 NOTE — ED Notes (Addendum)
[place]  CELL [phone]  [place] CELL [phone]     PLEASE CALL FAMILY WITH UPDATE. PT HAS GIVEN PERMISSION

## 2018-09-20 NOTE — Consult Note (Signed)
Chief Complaint: Patient was seen in consultation today for image guided liver lesion biopsy Chief Complaint  Patient presents with  . Abnormal Lab  . Weakness    Referring Physician(s): Bogue Chitto  Supervising Physician: Corrie Mckusick  Patient Status: [facility] - In-pt  History of Present Illness: Cheyenne Mckenzie is a 67 y.o. female smoker with history of hypertension, GERD, depression/anxiety, ADD, restless leg syndrome, chronic kidney disease, hypothyroidism who was recently admitted to [facility] with weakness, dizziness, poor oral intake, nausea, right upper quadrant abdominal pain, weight loss, elevated liver function tests and hypercalcemia.  Subsequent imaging has revealed multiple liver lesions, colonic diverticulosis, moderate size hiatal hernia, right breast mass with associated axillary adenopathy, and lung nodules in the lower lobes bilaterally.  Patient has no known history of cancer.  Request now received from oncology for image guided liver lesion biopsy for further evaluation.  Past Medical History:  Diagnosis Date  . ADD (attention deficit disorder)   . Anxiety   . Depression   . Essential hypertension   . RLS (restless legs syndrome)   . Thyroid disease   . Tobacco abuse     Past Surgical History:  Procedure Laterality Date  . KNEE SURGERY Left   . WRIST SURGERY Right     Allergies: Erythromycin base and Sulfamethoxazole  Medications: Prior to Admission medications   Medication Sig Start Date End Date Taking? Authorizing Provider  ALPRAZolam Duanne Moron) 0.5 MG tablet Take 0.5 mg by mouth 2 (two) times daily.   Yes [provider]  amphetamine-dextroamphetamine (ADDERALL) 30 MG tablet Take 30 mg by mouth 2 (two) times daily. 09/07/18  Yes [provider]  levothyroxine (SYNTHROID, LEVOTHROID) 137 MCG tablet Take 137 mcg by mouth daily. 08/21/18  Yes [provider]  PARoxetine (PAXIL) 40 MG tablet Take 40 mg by mouth daily.  06/26/18  Yes [provider]     Family History  Problem Relation Age of Onset  . Cancer Mother        Mother died of cancer, not know which type of cancer  . Cancer Father        Father died of cancer, not know which type of cancer.    Social History   Socioeconomic History  . Marital status: Single    Spouse name: Not on file  . Number of children: Not on file  . Years of education: Not on file  . Highest education level: Not on file  Occupational History  . Not on file  Social Needs  . Financial resource strain: Not on file  . Food insecurity:    Worry: Not on file    Inability: Not on file  . Transportation needs:    Medical: Not on file    Non-medical: Not on file  Tobacco Use  . Smoking status: Current Every Day Smoker  . Smokeless tobacco: Never Used  Substance and Sexual Activity  . Alcohol use: Not Currently  . Drug use: Never  . Sexual activity: Not Currently  Lifestyle  . Physical activity:    Days per week: Not on file    Minutes per session: Not on file  . Stress: Not on file  Relationships  . Social connections:    Talks on phone: Not on file    Gets together: Not on file    Attends religious service: Not on file    Active member of club or organization: Not on file    Attends meetings of clubs or  organizations: Not on file    Relationship status: Not on file  Other Topics Concern  . Not on file  Social History Narrative  . Not on file      Review of Systems  See above; denies fever, headache, chest pain, worsening dyspnea, cough, back pain, vomiting or abnormal bleeding. Vital Signs: BP 134/88   Pulse 79   Temp 98.4 F (36.9 C) (Oral)   Resp 18   Ht 5\' 8"  (1.727 m)   Wt 130 lb (59 kg)   SpO2 98%   BMI 19.77 kg/m   Physical Exam awake, alert.  Flat affect.  Chest with distant breath sounds bilaterally.  Heart with regular rate and rhythm.  Abdomen soft, positive bowel sounds, mildly tender right upper quadrant to palpation.  No  lower extremity edema.  Imaging: Ct Abdomen Pelvis Wo Contrast  Result Date: 09/20/2018 CLINICAL DATA:  67 year old female with abdominal pain. Abnormal liver enzymes. EXAM: CT ABDOMEN AND PELVIS WITHOUT CONTRAST TECHNIQUE: Multidetector CT imaging of the abdomen and pelvis was performed following the standard protocol without IV contrast. COMPARISON:  None. FINDINGS: Evaluation of this exam is limited in the absence of intravenous contrast. Lower chest: There is a 5 mm right lower lobe pulmonary nodule. The visualized lung bases are otherwise clear. There is coronary vascular calcification. No intra-abdominal free air or free fluid. Hepatobiliary: Multiple (greater than 20) hepatic hypodense lesions most consistent with metastatic disease. Clinical correlation and further evaluation with MRI or tissue sampling recommended. No intrahepatic biliary ductal dilatation. The gallbladder is unremarkable. Pancreas: Unremarkable. No pancreatic ductal dilatation or surrounding inflammatory changes. Spleen: Normal in size without focal abnormality. Adrenals/Urinary Tract: The adrenal glands are unremarkable. Mild bilateral renal parenchyma atrophy. A 4 mm vascular calcification versus less likely a nonobstructing right renal interpolar calculus. No hydronephrosis. There is no hydronephrosis or nephrolithiasis on the left. The visualized ureters and urinary bladder appear unremarkable. Stomach/Bowel: There is moderate stool within the colon. There is colonic diverticulosis without active inflammatory changes. There is a moderate size hiatal hernia. There is no bowel obstruction or active inflammation. Normal appendix. Vascular/Lymphatic: Moderate aortoiliac atherosclerotic disease. No portal venous gas. There is no adenopathy. Reproductive: The uterus is poorly visualized and may be atrophic or partially resected. No pelvic mass. Other: None Musculoskeletal: Osteopenia with scoliosis and degenerative changes of the  spine. No acute osseous pathology. IMPRESSION: 1. Multiple hepatic hypodense lesions most consistent with metastatic disease. Clinical correlation and further evaluation with MRI or tissue sampling recommended. 2. Colonic diverticulosis. No bowel obstruction or active inflammation. Normal appendix. 3. Moderate size hiatal hernia. Electronically Signed   By: Anner Crete M.D.   On: 09/20/2018 03:00   Dg Chest 2 View  Result Date: 09/20/2018 CLINICAL DATA:  67 year old female with weakness. EXAM: CHEST - 2 VIEW COMPARISON:  Chest radiograph dated 08/28/2009 FINDINGS: Mild eventration of the left hemidiaphragm with left lung base atelectasis. No focal consolidation, pleural effusion, or pneumothorax. Stable cardiac silhouette. Thoracic scoliosis. No acute osseous pathology. IMPRESSION: No active cardiopulmonary disease. Electronically Signed   By: Anner Crete M.D.   On: 09/20/2018 04:15   Ct Chest Wo Contrast  Result Date: 09/20/2018 CLINICAL DATA:  Patient with known hepatic metastatic disease, unknown primary EXAM: CT CHEST WITHOUT CONTRAST TECHNIQUE: Multidetector CT imaging of the chest was performed following the standard protocol without IV contrast. COMPARISON:  Abdominal CT from earlier in the same day. FINDINGS: Cardiovascular: Limited due to lack of IV contrast. Diffuse atherosclerotic changes are  identified. Tortuosity of the thoracic aorta is noted. Prominence of the ascending aorta 3.8 cm is noted. Coronary calcifications are seen. No significant cardiac enlargement is noted. Mediastinum/Nodes: Esophagus is within normal limits. The thoracic inlet is unremarkable. No significant hilar or mediastinal adenopathy is identified. A 10 mm lymph node is noted along the right inferior axilla as well as multiple lymph nodes within the right axilla. The largest of these measures approximately 10.7 mm in short axis. These are trouble some based on their multiplicity and asymmetry. Lungs/Pleura:  Diffuse emphysematous changes are noted. A 5 mm right lower lobe nodule is noted best seen on image number 79 of series 5. Mild scarring is noted in the left lung base. No focal infiltrate or effusion is seen. An ill-defined 6 mm nodule is noted within the left lower lobe. No other focal nodules are seen. No pneumothorax or effusion is noted. Upper Abdomen: Visualized upper abdomen again demonstrates multiple hypodense lesions scattered throughout the liver consistent with metastatic disease. Musculoskeletal: Degenerative changes of the thoracic spine are noted with associated scoliosis. No definitive rib abnormality is seen at this time. In the right breast in a retroareolar location, there is a 3.9 x 3.2 cm somewhat spiculated mass lesion with associated calcifications identified. This is highly suspicious for breast carcinoma given its spiculated appearance as well as the apparent nipple retraction. Correlation with the physical exam is recommended. Mammography may be helpful as well with possible breast biopsy. IMPRESSION: Right breast mass with spiculation as described above. Associated axillary adenopathy is identified. These changes are seen consistent with a primary breast carcinoma till proven otherwise. Mammography and ultrasound evaluation may be helpful. Lung nodules in the lower lobes bilaterally. These are of uncertain significance and may be unrelated to the underlying breast abnormality and liver abnormality. Stable hepatic metastatic disease. Mildly prominent ascending thoracic aorta. Aortic Atherosclerosis (ICD10-I70.0) and Emphysema (ICD10-J43.9). Electronically Signed   By: Inez Catalina M.D.   On: 09/20/2018 13:53    Labs:  CBC: Recent Labs    09/19/18 1948 09/20/18 0502  WBC 7.1 6.7  HGB 14.1 12.5  HCT 45.8 40.7  PLT 205 170    COAGS: Recent Labs    09/20/18 0502  INR 1.20  APTT 28    BMP: Recent Labs    09/19/18 1948 09/20/18 0502  NA 136 134*  K 4.6 4.2  CL 104  106  CO2 21* 21*  GLUCOSE 103* 111*  BUN 33* 36*  CALCIUM 10.7* 9.9  CREATININE 2.15* 2.17*  GFRNONAA 22* 22*  GFRAA 26* 26*    LIVER FUNCTION TESTS: Recent Labs    09/19/18 1948  BILITOT 0.8  AST 159*  ALT 68*  ALKPHOS 353*  PROT 7.1  ALBUMIN 3.2*    TUMOR MARKERS: No results for input(s): AFPTM, CEA, CA199, CHROMGRNA in the last 8760 hours.  Assessment and Plan: 67 y.o. female smoker with history of hypertension, GERD, depression/anxiety, ADD, restless leg syndrome, chronic kidney disease, hypothyroidism who was recently admitted to [facility] with weakness, dizziness, poor oral intake, nausea, right upper quadrant abdominal pain, weight loss, elevated liver function tests and hypercalcemia.  Subsequent imaging has revealed multiple liver lesions, colonic diverticulosis, moderate size hiatal hernia, right breast mass with associated axillary adenopathy, and lung nodules in the lower lobes bilaterally.  Patient has no known history of cancer.  Request now received from oncology for image guided liver lesion biopsy for further evaluation.  Imaging studies were reviewed by Dr. Earleen Newport and  case discussed with Dr. Lindi Adie.  Liver lesions appear amenable to biopsy.Risks and benefits discussed with the patient including, but not limited to bleeding, infection, damage to adjacent structures or low yield requiring additional tests.  All of the patient's questions were answered, patient is agreeable to proceed. Consent signed and in chart.  Procedure scheduled for 10/31.    Thank you for this interesting consult.  I greatly enjoyed meeting Cheyenne Mckenzie and look forward to participating in their care.  A copy of this report was sent to the requesting provider on this date.  Electronically Signed: D. Rowe Robert, PA-C 09/20/2018, 5:38 PM   I spent a total of  30 minutes   in face to face in clinical consultation, greater than 50% of which was counseling/coordinating care for  image guided liver lesion biopsy

## 2018-09-20 NOTE — ED Provider Notes (Signed)
[facility] DEPT Provider Note   CSN: [id number] Arrival date & time: 09/19/18  [place]     History   Chief Complaint Chief Complaint  Patient presents with  . Abnormal Lab  . Weakness    HPI Cheyenne Mckenzie is a 67 y.o. female.  Patient reports generally not feeling well for the past 2-3 months. States general malaise, poor appetite, unspecified wt loss. States has seen pcp w same and on recent labs was told elevated liver and kidney tests and to go to ER. Patient notes symptoms slowly progressive/worsening Intermittent crampy abd discomfort, no constant and/or focal abd pain. Intermittent nausea. No vomiting. Is having normal bms. Denies abd distension. No fever or chills. Denies headache. No chest pain or discomfort. No sob. No cough or uri symptoms. Denies dysuria or gu c/o.   The history is provided by the patient and a friend.  Abnormal Lab  Weakness  Pertinent negatives include no shortness of breath, no chest pain, no vomiting, no confusion and no headaches.    Past Medical History:  Diagnosis Date  . ADD (attention deficit disorder)   . Anxiety   . Depression   . Thyroid disease     There are no active problems to display for this patient.   History reviewed. No pertinent surgical history.   OB History   None      Home Medications    Prior to Admission medications   Medication Sig Start Date End Date Taking? Authorizing Provider  ALPRAZolam Duanne Moron) 0.5 MG tablet Take 0.5 mg by mouth 2 (two) times daily.   Yes [provider]  amphetamine-dextroamphetamine (ADDERALL) 30 MG tablet Take 30 mg by mouth 2 (two) times daily. 09/07/18  Yes [provider]  levothyroxine (SYNTHROID, LEVOTHROID) 137 MCG tablet Take 137 mcg by mouth daily. 08/21/18  Yes [provider]  PARoxetine (PAXIL) 40 MG tablet Take 40 mg by mouth daily. 06/26/18  Yes [provider]    Family History No family history on  file.  Social History Social History   Tobacco Use  . Smoking status: Current Every Day Smoker  . Smokeless tobacco: Never Used  Substance Use Topics  . Alcohol use: Not Currently  . Drug use: Not Currently     Allergies   Erythromycin base and Sulfamethoxazole   Review of Systems Review of Systems  Constitutional: Negative for fever.  HENT: Negative for sore throat.   Eyes: Negative for redness.  Respiratory: Negative for cough and shortness of breath.   Cardiovascular: Negative for chest pain.  Gastrointestinal: Negative for abdominal pain and vomiting.  Endocrine: Negative for polyuria.  Genitourinary: Negative for dysuria, flank pain and vaginal bleeding.  Musculoskeletal: Negative for neck pain and neck stiffness.  Skin: Negative for rash.  Neurological: Positive for weakness. Negative for headaches.  Hematological: Does not bruise/bleed easily.  Psychiatric/Behavioral: Negative for confusion.     Physical Exam Updated Vital Signs BP 112/90 (BP Location: Left Arm)   Pulse 96   Temp 97.8 F (36.6 C) (Oral)   Resp 20   Ht 1.727 m (5\' 8" )   Wt 59 kg   SpO2 100%   BMI 19.77 kg/m   Physical Exam  Constitutional: She appears well-developed and well-nourished.  HENT:  Head: Atraumatic.  Mouth/Throat: Oropharynx is clear and moist.  Eyes: Pupils are equal, round, and reactive to light. Conjunctivae are normal. No scleral icterus.  Neck: Neck supple. No tracheal deviation present. No thyromegaly present.  Cardiovascular: Normal rate, regular rhythm, normal heart sounds and intact distal pulses. Exam reveals no gallop and no friction rub.  No murmur heard. Pulmonary/Chest: Effort normal and breath sounds normal. No respiratory distress.  Abdominal: Soft. Normal appearance and bowel sounds are normal. She exhibits no distension and no mass. There is no tenderness. There is no rebound and no guarding.  Genitourinary:  Genitourinary Comments: No cva tenderness.    Musculoskeletal: She exhibits no edema or tenderness.  Neurological: She is alert.  Speech clear/fluent. Steady gait.   Skin: Skin is warm and dry. No rash noted.  Psychiatric: She has a normal mood and affect.  Nursing note and vitals reviewed.    ED Treatments / Results  Labs (all labs ordered are listed, but only abnormal results are displayed) Results for orders placed or performed during the hospital encounter of 63/87/56  Basic metabolic panel  Result Value Ref Range   Sodium 136 135 - 145 mmol/L   Potassium 4.6 3.5 - 5.1 mmol/L   Chloride 104 98 - 111 mmol/L   CO2 21 (L) 22 - 32 mmol/L   Glucose, Bld 103 (H) 70 - 99 mg/dL   BUN 33 (H) 8 - 23 mg/dL   Creatinine, Ser 2.15 (H) 0.44 - 1.00 mg/dL   Calcium 10.7 (H) 8.9 - 10.3 mg/dL   GFR calc non Af Amer 22 (L) >60 mL/min   GFR calc Af Amer 26 (L) >60 mL/min   Anion gap 11 5 - 15  CBC  Result Value Ref Range   WBC 7.1 4.0 - 10.5 K/uL   RBC 4.66 3.87 - 5.11 MIL/uL   Hemoglobin 14.1 12.0 - 15.0 g/dL   HCT 45.8 36.0 - 46.0 %   MCV 98.3 80.0 - 100.0 fL   MCH 30.3 26.0 - 34.0 pg   MCHC 30.8 30.0 - 36.0 g/dL   RDW 13.8 11.5 - 15.5 %   Platelets 205 150 - 400 K/uL   nRBC 0.0 0.0 - 0.2 %  Hepatic function panel  Result Value Ref Range   Total Protein 7.1 6.5 - 8.1 g/dL   Albumin 3.2 (L) 3.5 - 5.0 g/dL   AST 159 (H) 15 - 41 U/L   ALT 68 (H) 0 - 44 U/L   Alkaline Phosphatase 353 (H) 38 - 126 U/L   Total Bilirubin 0.8 0.3 - 1.2 mg/dL   Bilirubin, Direct 0.5 (H) 0.0 - 0.2 mg/dL   Indirect Bilirubin 0.3 0.3 - 0.9 mg/dL  CBG monitoring, ED  Result Value Ref Range   Glucose-Capillary 90 70 - 99 mg/dL    EKG None  Radiology Ct Abdomen Pelvis Wo Contrast  Result Date: 09/20/2018 CLINICAL DATA:  67 year old female with abdominal pain. Abnormal liver enzymes. EXAM: CT ABDOMEN AND PELVIS WITHOUT CONTRAST TECHNIQUE: Multidetector CT imaging of the abdomen and pelvis was performed following the standard protocol without IV  contrast. COMPARISON:  None. FINDINGS: Evaluation of this exam is limited in the absence of intravenous contrast. Lower chest: There is a 5 mm right lower lobe pulmonary nodule. The visualized lung bases are otherwise clear. There is coronary vascular calcification. No intra-abdominal free air or free fluid. Hepatobiliary: Multiple (greater than 20) hepatic hypodense lesions most consistent with metastatic disease. Clinical correlation and further evaluation with MRI or tissue sampling recommended. No intrahepatic biliary ductal dilatation. The gallbladder is unremarkable. Pancreas: Unremarkable. No pancreatic ductal dilatation or surrounding inflammatory changes. Spleen: Normal in size without focal abnormality. Adrenals/Urinary Tract: The adrenal glands are  unremarkable. Mild bilateral renal parenchyma atrophy. A 4 mm vascular calcification versus less likely a nonobstructing right renal interpolar calculus. No hydronephrosis. There is no hydronephrosis or nephrolithiasis on the left. The visualized ureters and urinary bladder appear unremarkable. Stomach/Bowel: There is moderate stool within the colon. There is colonic diverticulosis without active inflammatory changes. There is a moderate size hiatal hernia. There is no bowel obstruction or active inflammation. Normal appendix. Vascular/Lymphatic: Moderate aortoiliac atherosclerotic disease. No portal venous gas. There is no adenopathy. Reproductive: The uterus is poorly visualized and may be atrophic or partially resected. No pelvic mass. Other: None Musculoskeletal: Osteopenia with scoliosis and degenerative changes of the spine. No acute osseous pathology. IMPRESSION: 1. Multiple hepatic hypodense lesions most consistent with metastatic disease. Clinical correlation and further evaluation with MRI or tissue sampling recommended. 2. Colonic diverticulosis. No bowel obstruction or active inflammation. Normal appendix. 3. Moderate size hiatal hernia.  Electronically Signed   By: Anner Crete M.D.   On: 09/20/2018 03:00    Procedures Procedures (including critical care time)  Medications Ordered in ED Medications  sodium chloride 0.9 % bolus 1,000 mL (1,000 mLs Intravenous New Bag/Given 09/20/18 0015)  iohexol (OMNIPAQUE) 300 MG/ML solution 30 mL (has no administration in time range)     Initial Impression / Assessment and Plan / ED Course  I have reviewed the triage vital signs and the nursing notes.  Pertinent labs & imaging results that were available during my care of the patient were reviewed by me and considered in my medical decision making (see chart for details).  Iv ns. Labs.   Reviewed nursing notes and prior charts for additional history. Reviewed outside labs, recent aki compared to prior labs several months ago. Pt does note takes 5-6 excedrin tablets a day, 2 at a time - states has taken same dose/amount for years, no recent change. Denies other acetaminophen or nsaid use. Denies recent/heavy etoh use.   Imaging ordered.   Labs reviewed  - aki on recent labs in past 2 weeks, stable, but increase as compared to 6 months prior. lfts are persistently elevated as well, but not acutely worse today.   Ct reviewed - multiple liver lesions. Discussed w pt.   Cxr.  Given recent aki, elev lfts, progressive weakness/wt loss, will admit for ivf, additional evaluation.  hospitalist consulted for admission.    Final Clinical Impressions(s) / ED Diagnoses   Final diagnoses:  None    ED Discharge Orders    None       Lajean Saver, MD 09/20/18 [phone]

## 2018-09-20 NOTE — H&P (Signed)
History and Physical    Cheyenne Mckenzie JKD:[id number] DOB: [birth year] DOA: 09/19/2018  Referring MD/NP/PA:   PCP: Patient, [name]   Patient coming from:  The patient is coming from home.  At baseline, pt is independent for most of ADL.  Chief Complaint: Generalized weakness, and abnormal lab  HPI: Cheyenne Mckenzie is a 67 y.o. female with medical history significant of hypertension, GERD, depression with anxiety, ADD, smoking, restless leg syndrome, CKD-III, who presents with generalized weakness and abnormal lab.  Per patient's friends who is living with the patient, patient has been having generalized weakness for several months, which has worsened recently.  He also has dizziness, decreased oral intake, nausea, no vomiting, diarrhea or abdominal pain.  Patient denies chest pain, shortness breath, cough, fever or chills.  Sometimes patient has right flank pain, not having any pain today.  No symptoms of UTI or unilateral weakness.  Patient has more than 25 pounds of unintentional body weight loss in the past 4 months. Per patient's friends, her doctor adjusted her blood pressure medications.  Initially PCP put her on a medication (not know the name) which caused severe cough, then switched to amlodipine 5 mg daily.  Per her friends, her symptoms started since blood pressure medication was adjusted. She had episode of hypotension recently. Patient was seen by PCP on Tuesday, found to have abnormal liver function and acute renal injury.   ED Course: pt was found to have WBC 7.1, creatinine 2.15, BUN 33, pending urinalysis, pending lipase, temperature normal, blood pressure 95/68, 152/104, tachycardia, oxygen saturation 95% on room air.  Chest x-ray negative.  CT scan of abdomen/pelvis showed multiple liver lesions suggesting metastasized disease.  Patient is placed on MedSurg bed for observation.  Review of Systems:   General: no fevers, chills, has body weight gain, has poor appetite, has  fatigue HEENT: no blurry vision, hearing changes or sore throat Respiratory: no dyspnea, coughing, wheezing CV: no chest pain, no palpitations GI: has nausea, no vomiting, abdominal pain, diarrhea, constipation GU: no dysuria, burning on urination, increased urinary frequency, hematuria  Ext: no leg edema Neuro: no unilateral weakness, numbness, or tingling, no vision change or hearing loss Skin: no rash, no skin tear. MSK: No muscle spasm, no deformity, no limitation of range of movement in spin Heme: No easy bruising.  Travel history: No recent long distant travel.  Allergy:  Allergies  Allergen Reactions  . Erythromycin Base Nausea And Vomiting  . Sulfamethoxazole Nausea And Vomiting    Can only take with food    Past Medical History:  Diagnosis Date  . ADD (attention deficit disorder)   . Anxiety   . Depression   . Essential hypertension   . RLS (restless legs syndrome)   . Thyroid disease   . Tobacco abuse     Past Surgical History:  Procedure Laterality Date  . KNEE SURGERY Left   . WRIST SURGERY Right     Social History:  reports that she has been smoking. She has never used smokeless tobacco. She reports that she drank alcohol. She reports that she does not use drugs.  Family History:  Family History  Problem Relation Age of Onset  . Cancer Mother        Mother died of cancer, not know which type of cancer  . Cancer Father        Father died of cancer, not know which type of cancer.     Prior to Admission medications  Medication Sig Start Date End Date Taking? Authorizing Provider  ALPRAZolam Duanne Moron) 0.5 MG tablet Take 0.5 mg by mouth 2 (two) times daily.   Yes [provider]  amphetamine-dextroamphetamine (ADDERALL) 30 MG tablet Take 30 mg by mouth 2 (two) times daily. 09/07/18  Yes [provider]  levothyroxine (SYNTHROID, LEVOTHROID) 137 MCG tablet Take 137 mcg by mouth daily. 08/21/18  Yes [provider]  PARoxetine  (PAXIL) 40 MG tablet Take 40 mg by mouth daily. 06/26/18  Yes [provider]    Physical Exam: Vitals:   09/20/18 0300 09/20/18 0345 09/20/18 0430 09/20/18 0500  BP: (!) 152/104 120/85 131/90 (!) 129/94  Pulse: 91 70 87 85  Resp: 20 (!) 24 16 19   Temp:      TempSrc:      SpO2: 95% 91% 96% 95%  Weight:      Height:       General: Not in acute distress.  Dry mucous and membrane HEENT:       Eyes: PERRL, EOMI, no scleral icterus.       ENT: No discharge from the ears and nose, no pharynx injection, no tonsillar enlargement.        Neck: No JVD, no bruit, no mass felt. Heme: No neck lymph node enlargement. Cardiac: S1/S2, RRR, No murmurs, No gallops or rubs. Respiratory: No rales, wheezing, rhonchi or rubs. GI: Soft, nondistended, nontender, no rebound pain, no organomegaly, BS present. GU: No hematuria Ext: No pitting leg edema bilaterally. 2+DP/PT pulse bilaterally. Musculoskeletal: No joint deformities, No joint redness or warmth, no limitation of ROM in spin. Skin: No rashes.  Neuro: Alert, oriented X3, cranial nerves II-XII grossly intact, moves all extremities normally. Psych: Patient is not psychotic, no suicidal or hemocidal ideation.  Labs on Admission: I have personally reviewed following labs and imaging studies  CBC: Recent Labs  Lab 09/19/18 1948  WBC 7.1  HGB 14.1  HCT 45.8  MCV 98.3  PLT 010   Basic Metabolic Panel: Recent Labs  Lab 09/19/18 1948  NA 136  K 4.6  CL 104  CO2 21*  GLUCOSE 103*  BUN 33*  CREATININE 2.15*  CALCIUM 10.7*   GFR: Estimated Creatinine Clearance: 23 mL/min (A) (by C-G formula based on SCr of 2.15 mg/dL (H)). Liver Function Tests: Recent Labs  Lab 09/19/18 1948  AST 159*  ALT 68*  ALKPHOS 353*  BILITOT 0.8  PROT 7.1  ALBUMIN 3.2*   Recent Labs  Lab 09/20/18 0418  LIPASE 37   No results for input(s): AMMONIA in the last 168 hours. Coagulation Profile: No results for input(s): INR, PROTIME in the last  168 hours. Cardiac Enzymes: No results for input(s): CKTOTAL, CKMB, CKMBINDEX, TROPONINI in the last 168 hours. BNP (last 3 results) No results for input(s): PROBNP in the last 8760 hours. HbA1C: No results for input(s): HGBA1C in the last 72 hours. CBG: Recent Labs  Lab 09/20/18 0009  GLUCAP 90   Lipid Profile: No results for input(s): CHOL, HDL, LDLCALC, TRIG, CHOLHDL, LDLDIRECT in the last 72 hours. Thyroid Function Tests: No results for input(s): TSH, T4TOTAL, FREET4, T3FREE, THYROIDAB in the last 72 hours. Anemia Panel: No results for input(s): VITAMINB12, FOLATE, FERRITIN, TIBC, IRON, RETICCTPCT in the last 72 hours. Urine analysis: No results found for: COLORURINE, APPEARANCEUR, LABSPEC, PHURINE, GLUCOSEU, HGBUR, BILIRUBINUR, KETONESUR, PROTEINUR, UROBILINOGEN, NITRITE, LEUKOCYTESUR Sepsis Labs: @LABRCNTIP (procalcitonin:4,lacticidven:4) )No results found for this or any previous visit (from the past 240 hour(s)).   Radiological Exams on  Admission: Ct Abdomen Pelvis Wo Contrast  Result Date: 09/20/2018 CLINICAL DATA:  67 year old female with abdominal pain. Abnormal liver enzymes. EXAM: CT ABDOMEN AND PELVIS WITHOUT CONTRAST TECHNIQUE: Multidetector CT imaging of the abdomen and pelvis was performed following the standard protocol without IV contrast. COMPARISON:  None. FINDINGS: Evaluation of this exam is limited in the absence of intravenous contrast. Lower chest: There is a 5 mm right lower lobe pulmonary nodule. The visualized lung bases are otherwise clear. There is coronary vascular calcification. No intra-abdominal free air or free fluid. Hepatobiliary: Multiple (greater than 20) hepatic hypodense lesions most consistent with metastatic disease. Clinical correlation and further evaluation with MRI or tissue sampling recommended. No intrahepatic biliary ductal dilatation. The gallbladder is unremarkable. Pancreas: Unremarkable. No pancreatic ductal dilatation or surrounding  inflammatory changes. Spleen: Normal in size without focal abnormality. Adrenals/Urinary Tract: The adrenal glands are unremarkable. Mild bilateral renal parenchyma atrophy. A 4 mm vascular calcification versus less likely a nonobstructing right renal interpolar calculus. No hydronephrosis. There is no hydronephrosis or nephrolithiasis on the left. The visualized ureters and urinary bladder appear unremarkable. Stomach/Bowel: There is moderate stool within the colon. There is colonic diverticulosis without active inflammatory changes. There is a moderate size hiatal hernia. There is no bowel obstruction or active inflammation. Normal appendix. Vascular/Lymphatic: Moderate aortoiliac atherosclerotic disease. No portal venous gas. There is no adenopathy. Reproductive: The uterus is poorly visualized and may be atrophic or partially resected. No pelvic mass. Other: None Musculoskeletal: Osteopenia with scoliosis and degenerative changes of the spine. No acute osseous pathology. IMPRESSION: 1. Multiple hepatic hypodense lesions most consistent with metastatic disease. Clinical correlation and further evaluation with MRI or tissue sampling recommended. 2. Colonic diverticulosis. No bowel obstruction or active inflammation. Normal appendix. 3. Moderate size hiatal hernia. Electronically Signed   By: Anner Crete M.D.   On: 09/20/2018 03:00   Dg Chest 2 View  Result Date: 09/20/2018 CLINICAL DATA:  67 year old female with weakness. EXAM: CHEST - 2 VIEW COMPARISON:  Chest radiograph dated 08/28/2009 FINDINGS: Mild eventration of the left hemidiaphragm with left lung base atelectasis. No focal consolidation, pleural effusion, or pneumothorax. Stable cardiac silhouette. Thoracic scoliosis. No acute osseous pathology. IMPRESSION: No active cardiopulmonary disease. Electronically Signed   By: Anner Crete M.D.   On: 09/20/2018 04:15     EKG: Not done in ED   Assessment/Plan Principal Problem:   Acute renal  failure superimposed on stage 3 chronic kidney disease (HCC) Active Problems:   ADD (attention deficit disorder)   Depression with anxiety   Hypothyroidism   Abnormal LFTs   Lesion of liver   Hypercalcemia   Essential hypertension   GERD (gastroesophageal reflux disease)   Tobacco abuse   AoCKD-III: Baseline creatinine 1.4 on 03/07/2018.  Her creatinine is at 2.15, BUN 33.  Likely due to prerenal secondary to dehydration and continuation.  No hydronephrosis by CT scan of abdomen/pelvis.   -Placed on MedSurg bed for observation -IVF: 1 L normal saline bolus in ED, then 125 cc/h - Follow up renal function by BMP - Check FeNa   ADD (attention deficit disorder): -Adderall  Depression with anxiety:  No SI or HI -Xanax, Paxil  RLS: -start pramipexole nightly  Hypothyroidism: -Continue home Synthroid  Abnormal LFTs and lesion of liver: Abnormal liver function likely due to metastasized liver disease.  Primary cancer is not clear.  Patient never had colonoscopy.  No breast mass noted.  Chest x-ray is negative, no obvious lung mass. -Please call oncology in  the morning -keep pt NPO now-->if not do biopsy, will need start heart diet  Hypercalcemia: Likely due to malignancy.  Calcium 10.7.   -IV fluid as above -Check vitamin D 1.25, PTH level  HTN:  -Continue home medications: Amlodipine 5 mg daily -IV hydralazine prn  GERD: -Pepcid  Tobacco abuse: -Did counseling about importance of quitting smoking -Nicotine patch   DVT ppx: SCD Code Status: Full code Family Communication:  Yes, patient's  friend at bed side Disposition Plan:  Anticipate discharge back to previous home environment Consults called:  none Admission status: medical floor/obs      Date of Service 09/20/2018    Ivor Costa [facility] Pager [phone]  If 7PM-7AM, please contact night-coverage [email or web address] Password [email or web address] 09/20/2018, 5:12 AM

## 2018-09-20 NOTE — ED Notes (Signed)
Pt back from radiology 

## 2018-09-20 NOTE — ED Notes (Addendum)
Pt stated that her pain is back and she is having leg spasms. Terri, RN told MD

## 2018-09-20 NOTE — ED Notes (Signed)
CBG:90 

## 2018-09-20 NOTE — ED Notes (Signed)
Patient transported to CT 

## 2018-09-20 NOTE — ED Notes (Signed)
Pt aware that urine sample is needed.  

## 2018-09-21 ENCOUNTER — Inpatient Hospital Stay (HOSPITAL_COMMUNITY): Payer: Medicare Other

## 2018-09-21 DIAGNOSIS — G9341 Metabolic encephalopathy: Secondary | ICD-10-CM | POA: Diagnosis not present

## 2018-09-21 DIAGNOSIS — N183 Chronic kidney disease, stage 3 (moderate): Secondary | ICD-10-CM | POA: Diagnosis present

## 2018-09-21 DIAGNOSIS — F17200 Nicotine dependence, unspecified, uncomplicated: Secondary | ICD-10-CM

## 2018-09-21 DIAGNOSIS — N179 Acute kidney failure, unspecified: Secondary | ICD-10-CM

## 2018-09-21 DIAGNOSIS — I129 Hypertensive chronic kidney disease with stage 1 through stage 4 chronic kidney disease, or unspecified chronic kidney disease: Secondary | ICD-10-CM | POA: Diagnosis present

## 2018-09-21 DIAGNOSIS — R918 Other nonspecific abnormal finding of lung field: Secondary | ICD-10-CM

## 2018-09-21 DIAGNOSIS — C787 Secondary malignant neoplasm of liver and intrahepatic bile duct: Secondary | ICD-10-CM | POA: Diagnosis present

## 2018-09-21 DIAGNOSIS — E860 Dehydration: Secondary | ICD-10-CM

## 2018-09-21 DIAGNOSIS — E039 Hypothyroidism, unspecified: Secondary | ICD-10-CM | POA: Diagnosis present

## 2018-09-21 DIAGNOSIS — K219 Gastro-esophageal reflux disease without esophagitis: Secondary | ICD-10-CM | POA: Diagnosis present

## 2018-09-21 DIAGNOSIS — Z882 Allergy status to sulfonamides status: Secondary | ICD-10-CM | POA: Diagnosis not present

## 2018-09-21 DIAGNOSIS — C50919 Malignant neoplasm of unspecified site of unspecified female breast: Secondary | ICD-10-CM

## 2018-09-21 DIAGNOSIS — Z800 Family history of malignant neoplasm of digestive organs: Secondary | ICD-10-CM | POA: Diagnosis not present

## 2018-09-21 DIAGNOSIS — C50911 Malignant neoplasm of unspecified site of right female breast: Secondary | ICD-10-CM | POA: Diagnosis present

## 2018-09-21 DIAGNOSIS — I951 Orthostatic hypotension: Secondary | ICD-10-CM | POA: Diagnosis present

## 2018-09-21 DIAGNOSIS — K5730 Diverticulosis of large intestine without perforation or abscess without bleeding: Secondary | ICD-10-CM | POA: Diagnosis present

## 2018-09-21 DIAGNOSIS — F329 Major depressive disorder, single episode, unspecified: Secondary | ICD-10-CM | POA: Diagnosis present

## 2018-09-21 DIAGNOSIS — G2581 Restless legs syndrome: Secondary | ICD-10-CM | POA: Diagnosis present

## 2018-09-21 DIAGNOSIS — F988 Other specified behavioral and emotional disorders with onset usually occurring in childhood and adolescence: Secondary | ICD-10-CM | POA: Diagnosis present

## 2018-09-21 DIAGNOSIS — Z681 Body mass index (BMI) 19 or less, adult: Secondary | ICD-10-CM | POA: Diagnosis not present

## 2018-09-21 DIAGNOSIS — Z8673 Personal history of transient ischemic attack (TIA), and cerebral infarction without residual deficits: Secondary | ICD-10-CM | POA: Diagnosis not present

## 2018-09-21 DIAGNOSIS — Z881 Allergy status to other antibiotic agents status: Secondary | ICD-10-CM | POA: Diagnosis not present

## 2018-09-21 DIAGNOSIS — K5909 Other constipation: Secondary | ICD-10-CM | POA: Diagnosis present

## 2018-09-21 DIAGNOSIS — F419 Anxiety disorder, unspecified: Secondary | ICD-10-CM | POA: Diagnosis present

## 2018-09-21 DIAGNOSIS — R627 Adult failure to thrive: Secondary | ICD-10-CM | POA: Diagnosis present

## 2018-09-21 LAB — CBC WITH DIFFERENTIAL/PLATELET
ABS IMMATURE GRANULOCYTES: 0.03 10*3/uL (ref 0.00–0.07)
BASOS ABS: 0 10*3/uL (ref 0.0–0.1)
BASOS PCT: 1 %
Eosinophils Absolute: 0.1 10*3/uL (ref 0.0–0.5)
Eosinophils Relative: 1 %
HCT: 39.3 % (ref 36.0–46.0)
Hemoglobin: 12 g/dL (ref 12.0–15.0)
IMMATURE GRANULOCYTES: 1 %
LYMPHS PCT: 13 %
Lymphs Abs: 0.7 10*3/uL (ref 0.7–4.0)
MCH: 30.1 pg (ref 26.0–34.0)
MCHC: 30.5 g/dL (ref 30.0–36.0)
MCV: 98.5 fL (ref 80.0–100.0)
MONO ABS: 0.8 10*3/uL (ref 0.1–1.0)
Monocytes Relative: 13 %
NEUTROS ABS: 4.1 10*3/uL (ref 1.7–7.7)
NEUTROS PCT: 71 %
NRBC: 0 % (ref 0.0–0.2)
PLATELETS: 162 10*3/uL (ref 150–400)
RBC: 3.99 MIL/uL (ref 3.87–5.11)
RDW: 13.9 % (ref 11.5–15.5)
WBC: 5.8 10*3/uL (ref 4.0–10.5)

## 2018-09-21 LAB — CANCER ANTIGEN 15-3: CA 15-3: 503 U/mL — ABNORMAL HIGH (ref 0.0–25.0)

## 2018-09-21 LAB — COMPREHENSIVE METABOLIC PANEL
ALBUMIN: 2.6 g/dL — AB (ref 3.5–5.0)
ALT: 63 U/L — AB (ref 0–44)
AST: 150 U/L — ABNORMAL HIGH (ref 15–41)
Alkaline Phosphatase: 281 U/L — ABNORMAL HIGH (ref 38–126)
Anion gap: 8 (ref 5–15)
BUN: 31 mg/dL — ABNORMAL HIGH (ref 8–23)
CO2: 19 mmol/L — AB (ref 22–32)
CREATININE: 1.81 mg/dL — AB (ref 0.44–1.00)
Calcium: 9.6 mg/dL (ref 8.9–10.3)
Chloride: 111 mmol/L (ref 98–111)
GFR calc non Af Amer: 27 mL/min — ABNORMAL LOW (ref >60)
GFR, EST AFRICAN AMERICAN: 32 mL/min — AB (ref >60)
GLUCOSE: 78 mg/dL (ref 70–99)
Potassium: 4.5 mmol/L (ref 3.5–5.1)
SODIUM: 138 mmol/L (ref 135–145)
Total Bilirubin: 1.3 mg/dL — ABNORMAL HIGH (ref 0.3–1.2)
Total Protein: 6.1 g/dL — ABNORMAL LOW (ref 6.5–8.1)

## 2018-09-21 LAB — PARATHYROID HORMONE, INTACT (NO CA): PTH: 9 pg/mL — ABNORMAL LOW (ref 15–65)

## 2018-09-21 LAB — CANCER ANTIGEN 27.29: CAN 27.29: 469.2 U/mL — AB (ref 0.0–38.6)

## 2018-09-21 LAB — CORTISOL: CORTISOL PLASMA: 14.2 ug/dL

## 2018-09-21 LAB — AMMONIA: Ammonia: 41 umol/L — ABNORMAL HIGH (ref 9–35)

## 2018-09-21 LAB — CALCITRIOL (1,25 DI-OH VIT D): Vit D, 1,25-Dihydroxy: 13.1 pg/mL — ABNORMAL LOW (ref 19.9–79.3)

## 2018-09-21 MED ORDER — FENTANYL CITRATE (PF) 100 MCG/2ML IJ SOLN
INTRAMUSCULAR | Status: AC | PRN
  Administered 2018-09-21 (×2): 25 ug via INTRAVENOUS

## 2018-09-21 MED ORDER — FENTANYL CITRATE (PF) 100 MCG/2ML IJ SOLN
INTRAMUSCULAR | Status: AC
  Filled 2018-09-21: qty 2

## 2018-09-21 MED ORDER — METOPROLOL TARTRATE 12.5 MG HALF TABLET
12.5000 mg | ORAL_TABLET | Freq: Two times a day (BID) | ORAL | Status: DC
  Administered 2018-09-21 – 2018-09-22 (×2): 12.5 mg via ORAL
  Filled 2018-09-21 (×2): qty 1

## 2018-09-21 MED ORDER — LIDOCAINE HCL 1 % IJ SOLN
INTRAMUSCULAR | Status: AC
  Filled 2018-09-21: qty 20

## 2018-09-21 MED ORDER — MIDAZOLAM HCL 2 MG/2ML IJ SOLN
INTRAMUSCULAR | Status: AC | PRN
  Administered 2018-09-21 (×2): 0.5 mg via INTRAVENOUS

## 2018-09-21 MED ORDER — MIDAZOLAM HCL 2 MG/2ML IJ SOLN
INTRAMUSCULAR | Status: AC
  Filled 2018-09-21: qty 2

## 2018-09-21 MED ORDER — GELATIN ABSORBABLE 12-7 MM EX MISC
CUTANEOUS | Status: AC
  Filled 2018-09-21: qty 1

## 2018-09-21 NOTE — Consult Note (Signed)
[place] CONSULT NOTE  Patient Care Team: Stephens Shire, MD as PCP - General (Family Medicine)  CHIEF COMPLAINTS/PURPOSE OF CONSULTATION:  Clinical suspicion of metastatic breast cancer  HISTORY OF PRESENTING ILLNESS:  Cheyenne Mckenzie 67 y.o. female is admitted to the hospital with generalized weakness for the past 3 to 4 weeks where she was not getting out of bed as well as 25 pounds of weight loss in the past 4 months.  She saw her primary care physician who performed lab work and noted that she had elevation of liver function tests.  She was also noted to have acute renal failure.  In the hospital she had CT scan that revealed right breast mass 3.9 cm retroareolar location associate with axillary lymphadenopathy as well as liver metastases.  She also had lung nodules bilaterally of uncertain significance.  She has extensive liver metastases at least 20 number.  I reviewed her records extensively and collaborated the history with the patient.  SUMMARY OF ONCOLOGIC HISTORY:  No history exists.     MEDICAL HISTORY:  Past Medical History:  Diagnosis Date  . ADD (attention deficit disorder)   . Anxiety   . Depression   . Essential hypertension   . RLS (restless legs syndrome)   . Thyroid disease   . Tobacco abuse     SURGICAL HISTORY: Past Surgical History:  Procedure Laterality Date  . KNEE SURGERY Left   . WRIST SURGERY Right     SOCIAL HISTORY: Social History   Socioeconomic History  . Marital status: Single    Spouse name: Not on file  . Number of children: Not on file  . Years of education: Not on file  . Highest education level: Not on file  Occupational History  . Not on file  Social Needs  . Financial resource strain: Not on file  . Food insecurity:    Worry: Not on file    Inability: Not on file  . Transportation needs:    Medical: Not on file    Non-medical: Not on file  Tobacco Use  . Smoking status: Current Every Day Smoker  .  Smokeless tobacco: Never Used  Substance and Sexual Activity  . Alcohol use: Not Currently  . Drug use: Never  . Sexual activity: Not Currently  Lifestyle  . Physical activity:    Days per week: Not on file    Minutes per session: Not on file  . Stress: Not on file  Relationships  . Social connections:    Talks on phone: Not on file    Gets together: Not on file    Attends religious service: Not on file    Active member of club or organization: Not on file    Attends meetings of clubs or organizations: Not on file    Relationship status: Not on file  . Intimate partner violence:    Fear of current or ex partner: Not on file    Emotionally abused: Not on file    Physically abused: Not on file    Forced sexual activity: Not on file  Other Topics Concern  . Not on file  Social History Narrative  . Not on file    FAMILY HISTORY: Family History  Problem Relation Age of Onset  . Cancer Mother        Mother died of cancer, not know which type of cancer  . Cancer Father        Father died of cancer, not  know which type of cancer.    ALLERGIES:  is allergic to erythromycin base and sulfamethoxazole.  MEDICATIONS:  Current Facility-Administered Medications  Medication Dose Route Frequency Provider Last Rate Last Dose  . 0.9 %  sodium chloride infusion   Intravenous Continuous Florencia Reasons, MD 75 mL/hr at 09/21/18 0631    . ALPRAZolam Duanne Moron) tablet 0.5 mg  0.5 mg Oral BID Ivor Costa, MD   0.5 mg at 09/21/18 1028  . amphetamine-dextroamphetamine (ADDERALL) tablet 30 mg  30 mg Oral BID Ivor Costa, MD   30 mg at 09/21/18 1028  . famotidine (PEPCID) tablet 20 mg  20 mg Oral Daily Ivor Costa, MD   20 mg at 09/21/18 1029  . hydrALAZINE (APRESOLINE) injection 5 mg  5 mg Intravenous Q2H PRN Ivor Costa, MD      . levothyroxine (SYNTHROID, LEVOTHROID) tablet 137 mcg  137 mcg Oral Q0600 Ivor Costa, MD   137 mcg at 09/21/18 1610  . nicotine (NICODERM CQ - dosed in mg/24 hours) patch 21 mg  21  mg Transdermal Daily Ivor Costa, MD   21 mg at 09/21/18 1029  . ondansetron (ZOFRAN) tablet 4 mg  4 mg Oral Q6H PRN Ivor Costa, MD       Or  . ondansetron [facility]) injection 4 mg  4 mg Intravenous Q6H PRN Ivor Costa, MD      . oxyCODONE (Oxy IR/ROXICODONE) immediate release tablet 5 mg  5 mg Oral Q6H PRN Ivor Costa, MD   5 mg at 09/20/18 1054  . PARoxetine (PAXIL) tablet 40 mg  40 mg Oral Daily Ivor Costa, MD   40 mg at 09/21/18 1029  . pramipexole (MIRAPEX) tablet 0.125 mg  0.125 mg Oral QHS Ivor Costa, MD   0.125 mg at 09/20/18 2208  . senna-docusate (Senokot-S) tablet 1 tablet  1 tablet Oral QHS PRN Ivor Costa, MD      . zolpidem (AMBIEN) tablet 5 mg  5 mg Oral QHS PRN Ivor Costa, MD        REVIEW OF SYSTEMS:   Constitutional: Denies fevers, chills or abnormal night sweats Eyes: Denies blurriness of vision, double vision or watery eyes Ears, nose, mouth, throat, and face: Denies mucositis or sore throat Respiratory: Denies cough, dyspnea or wheezes Cardiovascular: Denies palpitation, chest discomfort or lower extremity swelling Gastrointestinal:  Denies nausea, heartburn or change in bowel habits Skin: Slightly jaundiced Lymphatics: Denies new lymphadenopathy or easy bruising Neurological:Denies numbness, tingling or new weaknesses Behavioral/Psych: Mood is stable, no new changes    All other systems were reviewed with the patient and are negative.  PHYSICAL EXAMINATION: ECOG PERFORMANCE STATUS: 3 - Symptomatic, >50% confined to bed  Vitals:   09/20/18 1708 09/20/18 2013  BP: 134/88 (!) 159/99  Pulse: 79 87  Resp: 18 20  Temp: 98.4 F (36.9 C) 98.3 F (36.8 C)  SpO2: 98% 100%   Filed Weights   09/19/18 1931  Weight: 130 lb (59 kg)    GENERAL:alert, no distress and comfortable SKIN: skin color, texture, turgor are normal, no rashes or significant lesions EYES: normal, conjunctiva are pink and non-injected, sclera clear OROPHARYNX:no exudate, no erythema and lips,  buccal mucosa, and tongue normal  NECK: supple, thyroid normal size, non-tender, without nodularity LYMPH:  no palpable lymphadenopathy in the cervical, axillary or inguinal LUNGS: clear to auscultation and percussion with normal breathing effort HEART: regular rate & rhythm and no murmurs and no lower extremity edema ABDOMEN:abdomen soft, non-tender and normal bowel sounds Musculoskeletal:no cyanosis  of digits and no clubbing  PSYCH: alert & oriented x 3 with fluent speech NEURO: no focal motor/sensory deficits   LABORATORY DATA:  I have reviewed the data as listed Lab Results  Component Value Date   WBC 5.8 09/21/2018   HGB 12.0 09/21/2018   HCT 39.3 09/21/2018   MCV 98.5 09/21/2018   PLT 162 09/21/2018   Lab Results  Component Value Date   NA 138 09/21/2018   K 4.5 09/21/2018   CL 111 09/21/2018   CO2 19 (L) 09/21/2018    RADIOGRAPHIC STUDIES: I have personally reviewed the radiological reports and agreed with the findings in the report.  ASSESSMENT AND PLAN:  Clinical suspicion of metastatic breast cancer: I discussed with interventional radiology and we felt that the liver biopsy is warranted.  She is scheduled to have this biopsy done today.  Based on the liver biopsy we will be able to determine what the histology is and then determine the treatment plan based on that.  Discussed with the patient and her family who were sitting in the room that she will need a lot of help and support as she goes through any treatment options.  We will not be able to determine the treatment plan until we have the final pathology report. I will see her in my clinic as an outpatient next week to finalize a treatment plan.  Goals of treatment: Palliation  All questions were answered. The patient knows to call the clinic with any problems, questions or concerns.    Harriette Ohara, MD @T @

## 2018-09-21 NOTE — Progress Notes (Signed)
PROGRESS NOTE  Cheyenne Mckenzie ZOX:[id number] DOB: 10/27/1951 DOA: 09/19/2018 PCP: Stephens Shire, MD  Brief Summary:  Progressive weakness, drowsiness, weight loss, she did not come to the hospital after her pcp advised her, however weakness progressed , she was not able get up from bed, she asked her roommate to sent her to the hospital, she is found to be confused, dehydrated and concerns for metastatic cancer, getting biopsy   HPI/Recap of past 24 hours:  She appear more alert today, but she is still not oriented to time and place,  She reports right sided rib pain when she coughs,  No fever  Family at bedside  Assessment/Plan: Principal Problem:   Acute renal failure superimposed on stage 3 chronic kidney disease ([place]) Active Problems:   ADD (attention deficit disorder)   Depression with anxiety   Hypothyroidism   Abnormal LFTs   Liver masses   Hypercalcemia   Essential hypertension   GERD (gastroesophageal reflux disease)   Tobacco abuse   Metastatic breast cancer ([place])  Metastatic cancer -per sister patient 's mother died of fibrosarcoma and father died of colon cancer in the 1990's -case discussed with oncology Dr Irene Limbo who recommended ct chest which showed right sided breast mass -likely breast cancer with liver mets, will proceed with right breast mass biopsy -will follow oncology recommendations  Encephalopathy: -patient has been drowsy, not able to stay awake during conversation, per family patient has been drowsy for about 2 wks, only new meds is norvasc, patient has been on xanax since the late 1990's --mri no brain mets, no acute findings. Does show"Mild parenchymal brain volume loss.  Mild chronic small vessel ischemic changes. Old RIGHT cerebellar infarct" -ammonia only mildly elevated at 41 -she appear more alert on day two of hospitalization, however still not oriented to time and place, family reports this is not her baseline -continue hydration,     Poor oral intake, Dehydration, orthostatic hypotension: D/c norvasc, continue ivf,   AKI :  Last cr  0.6 in 2010 Ct ab: Mild bilateral renal parenchyma atrophy, nonobstructing 35mm right sided kidneystone , no obstructive nephropathy She denies urinary symptom Cr 2.15 on presentation, improving on hydration, cr 1.8 today,  Continue hydration, repeat bmp in am Renal dosing meds  Psych: has been on xanax/adderall/paxil since 1990's, no recent changes  Hypothyroidism: continue on synthroid  FTT: family reports patient lives with a friend for 15yrs, friend can stay with her 24/7, will likely need home health PT eval recommended SNF, family declined snf placement, will arrange home health  Code Status: full, confirmed   Family Communication: patient , friend [name] and sister at bedside  Disposition Plan: home with home health ( patient declined snf)   Consultants:  oncology  Procedures:  liver biopsy  Antibiotics:  none   Objective: BP (!) 159/99   Pulse 87   Temp 98.3 F (36.8 C) (Oral)   Resp 20   Ht 5\' 8"  (1.727 m)   Wt 59 kg   SpO2 100%   BMI 19.77 kg/m   Intake/Output Summary (Last 24 hours) at 09/21/2018 1416 Last data filed at 09/21/2018 0943 Gross per 24 hour  Intake 186.17 ml  Output -  Net 186.17 ml   Filed Weights   09/19/18 1931  Weight: 59 kg    Exam: Patient is examined daily including today on 09/21/2018, exams remain the same as of yesterday except that has changed    General: more alert, able to stay awake  during conversation, but not oriented to time or place, impaired short term memory, immediate recall 2/3  Cardiovascular: RRR  Respiratory: CTABL  Abdomen: Soft/ND/NT, positive BS  Musculoskeletal: No Edema  Neuro: more alert, not oriented  Data Reviewed: Basic Metabolic Panel: Recent Labs  Lab 09/19/18 1948 09/20/18 0502 09/21/18 0619  NA 136 134* 138  K 4.6 4.2 4.5  CL 104 106 111  CO2 21* 21* 19*  GLUCOSE  103* 111* 78  BUN 33* 36* 31*  CREATININE 2.15* 2.17* 1.81*  CALCIUM 10.7* 9.9 9.6   Liver Function Tests: Recent Labs  Lab 09/19/18 1948 09/21/18 0619  AST 159* 150*  ALT 68* 63*  ALKPHOS 353* 281*  BILITOT 0.8 1.3*  PROT 7.1 6.1*  ALBUMIN 3.2* 2.6*   Recent Labs  Lab 09/20/18 0418  LIPASE 37   Recent Labs  Lab 09/21/18 0619  AMMONIA 41*   CBC: Recent Labs  Lab 09/19/18 1948 09/20/18 0502 09/21/18 0619  WBC 7.1 6.7 5.8  NEUTROABS  --   --  4.1  HGB 14.1 12.5 12.0  HCT 45.8 40.7 39.3  MCV 98.3 96.4 98.5  PLT 205 170 162   Cardiac Enzymes:   No results for input(s): CKTOTAL, CKMB, CKMBINDEX, TROPONINI in the last 168 hours. BNP (last 3 results) No results for input(s): BNP in the last 8760 hours.  ProBNP (last 3 results) No results for input(s): PROBNP in the last 8760 hours.  CBG: Recent Labs  Lab 09/20/18 0009  GLUCAP 90    No results found for this or any previous visit (from the past 240 hour(s)).   Studies: Mr Brain Wo Contrast  Result Date: 09/20/2018 CLINICAL DATA:  Encephalopathy, confusion and general weakness. History of hypertension, liver metastasis. EXAM: MRI HEAD WITHOUT CONTRAST TECHNIQUE: Multiplanar, multiecho pulse sequences of the brain and surrounding structures were obtained without intravenous contrast. COMPARISON:  None. FINDINGS: Multiple sequences are moderately motion degraded. INTRACRANIAL CONTENTS: No reduced diffusion to suggest acute ischemia or hypercellular tumor. A few scattered faint punctate foci of susceptibility artifact, these could reflect chronic microhemorrhages or vessels. 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 Patchy supratentorial and pontine white matter FLAIR T2 hyperintensities. [place] T2 hyperintense signal bilateral basal ganglia and thalami associated with chronic small vessel ischemic changes/chronic hypertension. Mild parenchymal brain volume loss. No hydrocephalus. No suspicious parenchymal signal, masses,  mass effect. No abnormal extra-axial fluid collections. No extra-axial masses. VASCULAR: Normal major intracranial vascular flow voids present at skull base. SKULL AND UPPER CERVICAL SPINE: No abnormal sellar expansion. No suspicious calvarial bone marrow signal. Craniocervical junction maintained. Focal parietal scalp scarring. SINUSES/ORBITS: The mastoid air-cells and included paranasal sinuses are well-aerated.The included ocular globes and orbital contents are non-suspicious. OTHER: None. IMPRESSION: 1. No acute intracranial process or metastasis on this motion degraded noncontrast MRI head. 2. Mild parenchymal brain volume loss. 3. Mild chronic small vessel ischemic changes. Old RIGHT cerebellar infarct. Electronically Signed   By: Elon Alas M.D.   On: 09/20/2018 18:12    Scheduled Meds: . ALPRAZolam  0.5 mg Oral BID  . amphetamine-dextroamphetamine  30 mg Oral BID  . famotidine  20 mg Oral Daily  . levothyroxine  137 mcg Oral Q0600  . nicotine  21 mg Transdermal Daily  . PARoxetine  40 mg Oral Daily  . pramipexole  0.125 mg Oral QHS    Continuous Infusions: . sodium chloride 75 mL/hr at 09/21/18 0631     Time spent: 35 mins, case discussed with oncology and family I  have personally reviewed and interpreted on  09/21/2018 daily labs, imagings as discussed above under date review session and assessment and plans.  I reviewed all nursing notes, pharmacy notes, consultant notes,  vitals, pertinent old records  I have discussed plan of care as described above with RN , patient and family on 09/21/2018   Florencia Reasons MD, PhD  [facility] Pager [phone]. If 7PM-7AM, please contact night-coverage at [email or web address], password [facility] 09/21/2018, 2:16 PM  LOS: 0 days

## 2018-09-21 NOTE — Evaluation (Signed)
Physical Therapy Evaluation Patient Details Name: Cheyenne Mckenzie MRN: [id number] DOB: Jun 07, 1951 Today's Date: 09/21/2018   History of Present Illness  67 y.o. female smoker with history of hypertension, GERD, depression/anxiety, ADD, restless leg syndrome, chronic kidney disease, hypothyroidism who was recently admitted to [facility] with weakness, dizziness, poor oral intake, nausea, right upper quadrant abdominal pain, weight loss, elevated liver function tests and hypercalcemia.  Subsequent imaging has revealed multiple liver lesions, colonic diverticulosis, moderate size hiatal hernia, right breast mass with associated axillary adenopathy, and lung nodules in the lower lobes bilaterally.  Patient has no known history of cancer  Clinical Impression  Pt admitted with above diagnosis. Pt currently with functional limitations due to the deficits listed below (see PT Problem List).  Pt will benefit from skilled PT to increase their independence and safety with mobility to allow discharge to the venue listed below.   Pt's friend ("she does my hair") in room and reports pt is typically independent at home.  Pt appears a little confused today and presents with poor memory.  Pt very unsteady with ambulation and declined using any assistive device.  Recommend SNF upon d/c if pt's roommate cannot physically assist pt at home.     Follow Up Recommendations SNF;Supervision/Assistance - 24 hour    Equipment Recommendations  Rolling walker with 5" wheels(if pt agreeable)    Recommendations for Other Services       Precautions / Restrictions Precautions Precautions: Fall Restrictions Weight Bearing Restrictions: No      Mobility  Bed Mobility Overal bed mobility: Needs Assistance Bed Mobility: Supine to Sit;Sit to Supine     Supine to sit: Supervision Sit to supine: Supervision   General bed mobility comments: uncoordinated and effortful   Transfers Overall transfer level:  Needs assistance Equipment used: None Transfers: Sit to/from Stand Sit to Stand: Min assist         General transfer comment: assist to rise and steady, pt declined using assistive device  Ambulation/Gait Ambulation/Gait assistance: Min assist Gait Distance (Feet): 40 Feet Assistive device: None Gait Pattern/deviations: Step-through pattern;Decreased stride length;Narrow base of support;Drifts right/left     General Gait Details: very uncoordinated, unsteady gait, pt would not use RW, assist provided for occasional steadying, pt with grazing hand rail for more stability at times  Stairs            Wheelchair Mobility    Modified Rankin (Stroke Patients Only)       Balance Overall balance assessment: Mild deficits observed, not formally tested(pt and friend in room deny any known falls)                                           Pertinent Vitals/Pain Pain Assessment: No/denies pain    Home Living Family/patient expects to be discharged to:: Private residence Living Arrangements: Non-relatives/Friends           Home Layout: Able to live on main level with bedroom/bathroom Home Equipment: None Additional Comments: pt poor historian    Prior Function Level of Independence: Independent               Hand Dominance        Extremity/Trunk Assessment        Lower Extremity Assessment Lower Extremity Assessment: Generalized weakness       Communication   Communication: No difficulties  Cognition Arousal/Alertness: Awake/alert  Behavior During Therapy: Flat affect Overall Cognitive Status: No family/caregiver present to determine baseline cognitive functioning                                 General Comments: orientated to person and place however appears confused, poor memory of admission and current plan of care      General Comments      Exercises     Assessment/Plan    PT Assessment Patient needs  continued PT services  PT Problem List Decreased strength;Decreased mobility;Decreased activity tolerance;Decreased cognition;Decreased balance;Decreased knowledge of use of DME;Decreased safety awareness       PT Treatment Interventions DME instruction;Gait training;Therapeutic activities;Therapeutic exercise;Patient/family education;Functional mobility training;Balance training    PT Goals (Current goals can be found in the Care Plan section)  Acute Rehab PT Goals PT Goal Formulation: With patient Time For Goal Achievement: 10/05/18 Potential to Achieve Goals: Good    Frequency Min 2X/week   Barriers to discharge        Co-evaluation               AM-PAC PT "6 Clicks" Daily Activity  Outcome Measure Difficulty turning over in bed (including adjusting bedclothes, sheets and blankets)?: A Little Difficulty moving from lying on back to sitting on the side of the bed? : A Lot Difficulty sitting down on and standing up from a chair with arms (e.g., wheelchair, bedside commode, etc,.)?: Unable Help needed moving to and from a bed to chair (including a wheelchair)?: A Little Help needed walking in hospital room?: A Little Help needed climbing 3-5 steps with a railing? : A Lot 6 Click Score: 14    End of Session Equipment Utilized During Treatment: Gait belt Activity Tolerance: Patient limited by fatigue(pt also self limiting, ?cognition) Patient left: in bed;with bed alarm set;with call bell/phone within reach;with family/visitor present Nurse Communication: Mobility status PT Visit Diagnosis: Difficulty in walking, not elsewhere classified (R26.2)    Time: 1137-1150 PT Time Calculation (min) (ACUTE ONLY): 13 min   Charges:   PT Evaluation $PT Eval Low Complexity: Hutch[place], PT, DPT Acute Rehabilitation Services Office: [phone] Pager: [phone]  Trena Platt 09/21/2018, 12:26 PM

## 2018-09-21 NOTE — Care Management Obs Status (Signed)
MEDICARE OBSERVATION STATUS NOTIFICATION   Patient Details  Name: Cheyenne Mckenzie MRN: [id number] Date of Birth: 09-27-1951   Medicare Observation Status Notification Given:  Yes    Leeroy Cha, RN 09/21/2018, 1:11 PM

## 2018-09-21 NOTE — Procedures (Signed)
Interventional Radiology Procedure Note  Procedure: US guided biopsy of liver mass.  mx 18g core biopsy to pathology.  Complications: None Recommendations:  - Ok to shower tomorrow - Do not submerge for 7 days - Routine wound care   Signed,  Dulcy Fanny. Earleen Newport, DO

## 2018-09-22 ENCOUNTER — Encounter: Payer: Self-pay | Admitting: Hematology and Oncology

## 2018-09-22 ENCOUNTER — Telehealth: Payer: Self-pay | Admitting: Hematology and Oncology

## 2018-09-22 LAB — COMPREHENSIVE METABOLIC PANEL
ALK PHOS: 271 U/L — AB (ref 38–126)
ALT: 53 U/L — ABNORMAL HIGH (ref 0–44)
ANION GAP: 9 (ref 5–15)
AST: 121 U/L — ABNORMAL HIGH (ref 15–41)
Albumin: 2.5 g/dL — ABNORMAL LOW (ref 3.5–5.0)
BUN: 27 mg/dL — ABNORMAL HIGH (ref 8–23)
CALCIUM: 9.6 mg/dL (ref 8.9–10.3)
CO2: 19 mmol/L — AB (ref 22–32)
Chloride: 109 mmol/L (ref 98–111)
Creatinine, Ser: 1.47 mg/dL — ABNORMAL HIGH (ref 0.44–1.00)
GFR, EST AFRICAN AMERICAN: 41 mL/min — AB (ref >60)
GFR, EST NON AFRICAN AMERICAN: 35 mL/min — AB (ref >60)
Glucose, Bld: 71 mg/dL (ref 70–99)
Potassium: 4.4 mmol/L (ref 3.5–5.1)
Sodium: 137 mmol/L (ref 135–145)
TOTAL PROTEIN: 5.7 g/dL — AB (ref 6.5–8.1)
Total Bilirubin: 1.5 mg/dL — ABNORMAL HIGH (ref 0.3–1.2)

## 2018-09-22 LAB — CBC WITH DIFFERENTIAL/PLATELET
Abs Immature Granulocytes: 0.02 10*3/uL (ref 0.00–0.07)
Basophils Absolute: 0.1 10*3/uL (ref 0.0–0.1)
Basophils Relative: 1 %
Eosinophils Absolute: 0.1 10*3/uL (ref 0.0–0.5)
Eosinophils Relative: 1 %
HCT: 36.2 % (ref 36.0–46.0)
Hemoglobin: 11.1 g/dL — ABNORMAL LOW (ref 12.0–15.0)
Immature Granulocytes: 0 %
Lymphocytes Relative: 16 %
Lymphs Abs: 0.8 10*3/uL (ref 0.7–4.0)
MCH: 30.7 pg (ref 26.0–34.0)
MCHC: 30.7 g/dL (ref 30.0–36.0)
MCV: 100.3 fL — ABNORMAL HIGH (ref 80.0–100.0)
Monocytes Absolute: 0.8 10*3/uL (ref 0.1–1.0)
Monocytes Relative: 15 %
Neutro Abs: 3.5 10*3/uL (ref 1.7–7.7)
Neutrophils Relative %: 67 %
Platelets: 152 10*3/uL (ref 150–400)
RBC: 3.61 MIL/uL — ABNORMAL LOW (ref 3.87–5.11)
RDW: 14.2 % (ref 11.5–15.5)
WBC: 5.3 10*3/uL (ref 4.0–10.5)
nRBC: 0 % (ref 0.0–0.2)

## 2018-09-22 LAB — MAGNESIUM: MAGNESIUM: 1.8 mg/dL (ref 1.7–2.4)

## 2018-09-22 LAB — CEA: CEA1: 292 ng/mL — AB (ref 0.0–4.7)

## 2018-09-22 LAB — AFP TUMOR MARKER: AFP, Serum, Tumor Marker: 3.4 ng/mL (ref 0.0–8.3)

## 2018-09-22 MED ORDER — OXYCODONE HCL 5 MG PO TABS: 5.0000 mg | ORAL_TABLET | Freq: Four times a day (QID) | ORAL | 0 refills | Status: DC | PRN

## 2018-09-22 MED ORDER — SENNOSIDES-DOCUSATE SODIUM 8.6-50 MG PO TABS: 1.0000 | ORAL_TABLET | Freq: Every day | ORAL | 0 refills | Status: DC

## 2018-09-22 NOTE — Care Management Note (Signed)
Case Management Note  Patient Details  Name: Cheyenne Mckenzie MRN: [id number] Date of Birth: 29-Jul-1951  Subjective/Objective:                  hhc-rn, ot , pt, aide-[facility]oses [facility]  Action/Plan: [facility] notified of need of services.  Expected Discharge Date:  09/22/18               Expected Discharge Plan:  [place]  In-House Referral:     Discharge planning Services  CM Consult  Post Acute Care Choice:    Choice offered to:     DME Arranged:    DME Agency:     HH Arranged:  RN, PT, OT, Nurse's Aide [place] Agency:  [facility]  Status of Service:  Completed, signed off  If discussed at [place] of Stay Meetings, dates discussed:    Additional Comments:  Leeroy Cha, RN 09/22/2018, 10:16 AM

## 2018-09-22 NOTE — Telephone Encounter (Signed)
A hospital follow up appt has been scheduled for the pt to see Dr. Lindi Adie on 11/6 at 145pm. I spoke to Dr. Geralyn Flash nurse May, to ask if it was ok to double book prior to scheduling since there weren't any availabilities. I cld the pt and left the appt date and time on her voicemail. I will mail the pt a letter with the appointment information.

## 2018-09-22 NOTE — Discharge Summary (Signed)
Discharge Summary  Cheyenne Mckenzie OMV:[id number] DOB: [birth year]  PCP: Stephens Shire, MD  Admit date: 09/19/2018 Discharge date: 09/22/2018  Time spent: 79mins, more than 50% time spent on coordination of care.  Recommendations for Outpatient Follow-up:  1. F/u with PCP within a week  for hospital discharge follow up, repeat cbc/bmp at follow up. pcp to monitor blood pressure and patient's memory 2. F/u with oncology Dr Lindi Adie in one week 3. Maximize home health, patient declined snf  Discharge Diagnoses:  Active Hospital Problems   Diagnosis Date Noted  . Acute renal failure superimposed on stage 3 chronic kidney disease ([facility]) 09/20/2018  . Metastatic breast cancer ([place])   . Depression with anxiety 09/20/2018  . Hypothyroidism 09/20/2018  . Abnormal LFTs 09/20/2018  . Liver masses 09/20/2018  . Hypercalcemia 09/20/2018  . GERD (gastroesophageal reflux disease) 09/20/2018  . ADD (attention deficit disorder)   . Essential hypertension   . Tobacco abuse     Resolved Hospital Problems  No resolved problems to display.    Discharge Condition: stable  Diet recommendation: regular diet  Filed Weights   09/19/18 1931  Weight: 59 kg    History of present illness: (per admitting MD Dr Blaine Hamper) Patient coming from:  The patient is coming from home.  At baseline, pt is independent for most of ADL.  Chief Complaint: Generalized weakness, and abnormal lab  HPI: Cheyenne Mckenzie is a 67 y.o. female with medical history significant of hypertension, GERD, depression with anxiety, ADD, smoking, restless leg syndrome, CKD-III, who presents with generalized weakness and abnormal lab.  Per patient's friends who is living with the patient, patient has been having generalized weakness for several months, which has worsened recently.  He also has dizziness, decreased oral intake, nausea, no vomiting, diarrhea or abdominal pain.  Patient denies chest pain, shortness breath, cough, fever or  chills.  Sometimes patient has right flank pain, not having any pain today.  No symptoms of UTI or unilateral weakness.  Patient has more than 25 pounds of unintentional body weight loss in the past 4 months. Per patient's friends, her doctor adjusted her blood pressure medications.  Initially PCP put her on a medication (not know the name) which caused severe cough, then switched to amlodipine 5 mg daily.  Per her friends, her symptoms started since blood pressure medication was adjusted. She had episode of hypotension recently. Patient was seen by PCP on Tuesday, found to have abnormal liver function and acute renal injury.   ED Course: pt was found to have WBC 7.1, creatinine 2.15, BUN 33, pending urinalysis, pending lipase, temperature normal, blood pressure 95/68, 152/104, tachycardia, oxygen saturation 95% on room air.  Chest x-ray negative.  CT scan of abdomen/pelvis showed multiple liver lesions suggesting metastasized disease.  Patient is placed on MedSurg bed for observation.   Hospital Course:  Principal Problem:   Acute renal failure superimposed on stage 3 chronic kidney disease (H[facility]) Active Problems:   ADD (attention deficit disorder)   Depression with anxiety   Hypothyroidism   Abnormal LFTs   Liver masses   Hypercalcemia   Essential hypertension   GERD (gastroesophageal reflux disease)   Tobacco abuse   Metastatic breast cancer ([place])   Metastatic cancer -per sister patient 72 mother died of fibrosarcoma and father died of colon cancer in the 1990's -case discussed with oncology Dr Irene Limbo who recommended ct chest which showed right sided breast mass -likely breast cancer with liver mets, s/p US guided liver biopsy -  Cancer pain, right upper quadrant pain, prn oxycodone provided at discharge -she is to follow up with oncology next week  Encephalopathy: -patient has been drowsy, not able to stay awake during conversation, per family patient has been drowsy for about 2 wks,  only new meds is norvasc, patient has been on xanax since the late 1990's --mri no brain mets, no acute findings. Does show"Mild parenchymal brain volume loss.  Mild chronic small vessel ischemic changes. Old RIGHT cerebellar infarct" -ammonia only mildly elevated at 41 -she has improved a lot, she is fully alert at discharge, she is oriented to person and place, still not oriented to time    Poor oral intake, Dehydration, orthostatic hypotension: D/c norvasc,  She received ivf, improved  AKI :  Last cr  0.6 in 2010 Ct ab: Mild bilateral renal parenchyma atrophy, nonobstructing 31mm right sided kidneystone , no obstructive nephropathy She denies urinary symptom Cr 2.15 on presentation, improving with hydration, cr 1.47 today,  Renal dosing meds Encourage oral intake, follow up with pcp to repeat labs  Psych: has been on xanax/adderall/paxil since 1990's, no recent changes  Hypothyroidism: continue on synthroid  Chronic constipation: start senokot qhs  FTT: family reports patient lives with a friend for 16yrs, friend can stay with her 24/7, will likely need home health PT eval recommended SNF, family declined snf placement,  home health ordered  Code Status: full, confirmed   Family Communication: patient , friend [name] and sister at bedside  Disposition Plan: home with home health ( patient declined snf)   Consultants:  oncology  Procedures:  liver biopsy  Antibiotics:  none    Discharge Exam: BP 140/88 (BP Location: Left Arm)   Pulse 70   Temp 97.8 F (36.6 C) (Oral)   Resp 18   Ht 5\' 8"  (1.727 m)   Wt 59 kg   SpO2 96%   BMI 19.77 kg/m   General: NAD,fully alert, pleasant, not oriented to time Cardiovascular: RRR Respiratory: CTABL  Discharge Instructions You were cared for by a hospitalist during your hospital stay. If you have any questions about your discharge medications or the care you received while you were in the hospital after  you are discharged, you can call the unit and asked to speak with the hospitalist on call if the hospitalist that took care of you is not available. Once you are discharged, your primary care physician will handle any further medical issues. Please note that NO REFILLS for any discharge medications will be authorized once you are discharged, as it is imperative that you return to your primary care physician (or establish a relationship with a primary care physician if you do not have one) for your aftercare needs so that they can reassess your need for medications and monitor your lab values.  Discharge Instructions    Diet general   Complete by:  As directed    Increase activity slowly   Complete by:  As directed      Allergies as of 09/22/2018      Reactions   Erythromycin Base Nausea And Vomiting   Sulfamethoxazole Nausea And Vomiting   Can only take with food      Medication List    TAKE these medications   ALPRAZolam 0.5 MG tablet Commonly known as:  XANAX Take 0.5 mg by mouth 2 (two) times daily.   amphetamine-dextroamphetamine 30 MG tablet Commonly known as:  ADDERALL Take 30 mg by mouth 2 (two) times daily.   levothyroxine 137  MCG tablet Commonly known as:  SYNTHROID, LEVOTHROID Take 137 mcg by mouth daily.   oxyCODONE 5 MG immediate release tablet Commonly known as:  Oxy IR/ROXICODONE Take 1 tablet (5 mg total) by mouth every 6 (six) hours as needed for moderate pain.   PARoxetine 40 MG tablet Commonly known as:  PAXIL Take 40 mg by mouth daily.   senna-docusate 8.6-50 MG tablet Commonly known as:  Senokot-S Take 1 tablet by mouth at bedtime.      Allergies  Allergen Reactions  . Erythromycin Base Nausea And Vomiting  . Sulfamethoxazole Nausea And Vomiting    Can only take with food   Follow-up Information    Stephens Shire, MD Follow up in 1 week(s).   Specialty:  Family Medicine Why:  hospital discharge follow up, pcp to monitor blood pressure, pcp to  monitor memory  repeat cbc/bmp at follow up Contact information: [address] [place] [address] [place] [address] [phone]        Nicholas Lose, MD Follow up in 1 week(s).   Specialty:  Hematology and Oncology Why:  metastatic cancer Contact information: [place] [address] [phone]            The results of significant diagnostics from this hospitalization (including imaging, microbiology, ancillary and laboratory) are listed below for reference.    Significant Diagnostic Studies: Ct Abdomen Pelvis Wo Contrast  Result Date: 09/20/2018 CLINICAL DATA:  67 year old female with abdominal pain. Abnormal liver enzymes. EXAM: CT ABDOMEN AND PELVIS WITHOUT CONTRAST TECHNIQUE: Multidetector CT imaging of the abdomen and pelvis was performed following the standard protocol without IV contrast. COMPARISON:  None. FINDINGS: Evaluation of this exam is limited in the absence of intravenous contrast. Lower chest: There is a 5 mm right lower lobe pulmonary nodule. The visualized lung bases are otherwise clear. There is coronary vascular calcification. No intra-abdominal free air or free fluid. Hepatobiliary: Multiple (greater than 20) hepatic hypodense lesions most consistent with metastatic disease. Clinical correlation and further evaluation with MRI or tissue sampling recommended. No intrahepatic biliary ductal dilatation. The gallbladder is unremarkable. Pancreas: Unremarkable. No pancreatic ductal dilatation or surrounding inflammatory changes. Spleen: Normal in size without focal abnormality. Adrenals/Urinary Tract: The adrenal glands are unremarkable. Mild bilateral renal parenchyma atrophy. A 4 mm vascular calcification versus less likely a nonobstructing right renal interpolar calculus. No hydronephrosis. There is no hydronephrosis or nephrolithiasis on the left. The visualized ureters and urinary bladder appear unremarkable. Stomach/Bowel: There is  moderate stool within the colon. There is colonic diverticulosis without active inflammatory changes. There is a moderate size hiatal hernia. There is no bowel obstruction or active inflammation. Normal appendix. Vascular/Lymphatic: Moderate aortoiliac atherosclerotic disease. No portal venous gas. There is no adenopathy. Reproductive: The uterus is poorly visualized and may be atrophic or partially resected. No pelvic mass. Other: None Musculoskeletal: Osteopenia with scoliosis and degenerative changes of the spine. No acute osseous pathology. IMPRESSION: 1. Multiple hepatic hypodense lesions most consistent with metastatic disease. Clinical correlation and further evaluation with MRI or tissue sampling recommended. 2. Colonic diverticulosis. No bowel obstruction or active inflammation. Normal appendix. 3. Moderate size hiatal hernia. Electronically Signed   By: Anner Crete M.D.   On: 09/20/2018 03:00   Dg Chest 2 View  Result Date: 09/20/2018 CLINICAL DATA:  67 year old female with weakness. EXAM: CHEST - 2 VIEW COMPARISON:  Chest radiograph dated 08/28/2009 FINDINGS: Mild eventration of the left hemidiaphragm with left lung base atelectasis. No focal consolidation,  pleural effusion, or pneumothorax. Stable cardiac silhouette. Thoracic scoliosis. No acute osseous pathology. IMPRESSION: No active cardiopulmonary disease. Electronically Signed   By: Anner Crete M.D.   On: 09/20/2018 04:15   Ct Chest Wo Contrast  Result Date: 09/20/2018 CLINICAL DATA:  Patient with known hepatic metastatic disease, unknown primary EXAM: CT CHEST WITHOUT CONTRAST TECHNIQUE: Multidetector CT imaging of the chest was performed following the standard protocol without IV contrast. COMPARISON:  Abdominal CT from earlier in the same day. FINDINGS: Cardiovascular: Limited due to lack of IV contrast. Diffuse atherosclerotic changes are identified. Tortuosity of the thoracic aorta is noted. Prominence of the ascending aorta  3.8 cm is noted. Coronary calcifications are seen. No significant cardiac enlargement is noted. Mediastinum/Nodes: Esophagus is within normal limits. The thoracic inlet is unremarkable. No significant hilar or mediastinal adenopathy is identified. A 10 mm lymph node is noted along the right inferior axilla as well as multiple lymph nodes within the right axilla. The largest of these measures approximately 10.7 mm in short axis. These are trouble some based on their multiplicity and asymmetry. Lungs/Pleura: Diffuse emphysematous changes are noted. A 5 mm right lower lobe nodule is noted best seen on image number 79 of series 5. Mild scarring is noted in the left lung base. No focal infiltrate or effusion is seen. An ill-defined 6 mm nodule is noted within the left lower lobe. No other focal nodules are seen. No pneumothorax or effusion is noted. Upper Abdomen: Visualized upper abdomen again demonstrates multiple hypodense lesions scattered throughout the liver consistent with metastatic disease. Musculoskeletal: Degenerative changes of the thoracic spine are noted with associated scoliosis. No definitive rib abnormality is seen at this time. In the right breast in a retroareolar location, there is a 3.9 x 3.2 cm somewhat spiculated mass lesion with associated calcifications identified. This is highly suspicious for breast carcinoma given its spiculated appearance as well as the apparent nipple retraction. Correlation with the physical exam is recommended. Mammography may be helpful as well with possible breast biopsy. IMPRESSION: Right breast mass with spiculation as described above. Associated axillary adenopathy is identified. These changes are seen consistent with a primary breast carcinoma till proven otherwise. Mammography and ultrasound evaluation may be helpful. Lung nodules in the lower lobes bilaterally. These are of uncertain significance and may be unrelated to the underlying breast abnormality and liver  abnormality. Stable hepatic metastatic disease. Mildly prominent ascending thoracic aorta. Aortic Atherosclerosis (ICD10-I70.0) and Emphysema (ICD10-J43.9). Electronically Signed   By: Inez Catalina M.D.   On: 09/20/2018 13:53   Mr Brain Wo Contrast  Result Date: 09/20/2018 CLINICAL DATA:  Encephalopathy, confusion and general weakness. History of hypertension, liver metastasis. EXAM: MRI HEAD WITHOUT CONTRAST TECHNIQUE: Multiplanar, multiecho pulse sequences of the brain and surrounding structures were obtained without intravenous contrast. COMPARISON:  None. FINDINGS: Multiple sequences are moderately motion degraded. INTRACRANIAL CONTENTS: No reduced diffusion to suggest acute ischemia or hypercellular tumor. A few scattered faint punctate foci of susceptibility artifact, these could reflect chronic microhemorrhages or vessels. 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 Patchy supratentorial and pontine white matter FLAIR T2 hyperintensities. [place] T2 hyperintense signal bilateral basal ganglia and thalami associated with chronic small vessel ischemic changes/chronic hypertension. Mild parenchymal brain volume loss. No hydrocephalus. No suspicious parenchymal signal, masses, mass effect. No abnormal extra-axial fluid collections. No extra-axial masses. VASCULAR: Normal major intracranial vascular flow voids present at skull base. SKULL AND UPPER CERVICAL SPINE: No abnormal sellar expansion. No suspicious calvarial bone marrow signal. Craniocervical junction maintained.  Focal parietal scalp scarring. SINUSES/ORBITS: The mastoid air-cells and included paranasal sinuses are well-aerated.The included ocular globes and orbital contents are non-suspicious. OTHER: None. IMPRESSION: 1. No acute intracranial process or metastasis on this motion degraded noncontrast MRI head. 2. Mild parenchymal brain volume loss. 3. Mild chronic small vessel ischemic changes. Old RIGHT cerebellar infarct. Electronically Signed   By: Elon Alas M.D.   On: 09/20/2018 18:12   US Biopsy (liver)  Result Date: 09/22/2018 INDICATION: 67 year old female with clinical history of right breast carcinoma metastatic to the liver. She presents today for liver biopsy, with pending mammographies workup. EXAM: ULTRASOUND-GUIDED BIOPSY LIVER MEDICATIONS: None. ANESTHESIA/SEDATION: Moderate (conscious) sedation was employed during this procedure. A total of Versed 1.0 mg and Fentanyl 50 mcg was administered intravenously. Moderate Sedation Time: 10 minutes. The patient's level of consciousness and vital signs were monitored continuously by radiology nursing throughout the procedure under my direct supervision. FLUOROSCOPY TIME:  None COMPLICATIONS: None PROCEDURE: Informed written consent was obtained from the patient after a thorough discussion of the procedural risks, benefits and alternatives. All questions were addressed. Maximal Sterile Barrier Technique was utilized including caps, mask, sterile gowns, sterile gloves, sterile drape, hand hygiene and skin antiseptic. A timeout was performed prior to the initiation of the procedure. Patient positioned supine position. Ultrasound images of the liver were performed. The patient is prepped and draped in the usual sterile fashion. 1% lidocaine was used for local anesthesia. Ultrasound guidance was used to place needle into the right liver for targeting of heterogeneously hyperechoic liver lesion. Multiple 18 gauge core biopsy were acquired. Gel-Foam pledgets were infused. Needles removed and sterile bandage was placed. Patient tolerated the procedure well and remained hemodynamically stable throughout. No complications were encountered and no significant blood loss. IMPRESSION: Status post ultrasound-guided biopsy of right liver lobe lesion. Tissue specimen sent to pathology for complete histopathologic analysis. Signed, Dulcy Fanny. Dellia Nims, RPVI [facility]  Radiology Electronically Signed   By: Corrie Mckusick D.O.   On: 09/22/2018 09:42    Microbiology: No results found for this or any previous visit (from the past 240 hour(s)).   Labs: Basic Metabolic Panel: Recent Labs  Lab 09/19/18 1948 09/20/18 0502 09/21/18 0619 09/22/18 0717  NA 136 134* 138 137  K 4.6 4.2 4.5 4.4  CL 104 106 111 109  CO2 21* 21* 19* 19*  GLUCOSE 103* 111* 78 71  BUN 33* 36* 31* 27*  CREATININE 2.15* 2.17* 1.81* 1.47*  CALCIUM 10.7* 9.9 9.6 9.6  MG  --   --   --  1.8   Liver Function Tests: Recent Labs  Lab 09/19/18 1948 09/21/18 0619 09/22/18 0717  AST 159* 150* 121*  ALT 68* 63* 53*  ALKPHOS 353* 281* 271*  BILITOT 0.8 1.3* 1.5*  PROT 7.1 6.1* 5.7*  ALBUMIN 3.2* 2.6* 2.5*   Recent Labs  Lab 09/20/18 0418  LIPASE 37   Recent Labs  Lab 09/21/18 0619  AMMONIA 41*   CBC: Recent Labs  Lab 09/19/18 1948 09/20/18 0502 09/21/18 0619 09/22/18 0717  WBC 7.1 6.7 5.8 5.3  NEUTROABS  --   --  4.1 3.5  HGB 14.1 12.5 12.0 11.1*  HCT 45.8 40.7 39.3 36.2  MCV 98.3 96.4 98.5 100.3*  PLT 205 170 162 152   Cardiac Enzymes: No results for input(s): CKTOTAL, CKMB, CKMBINDEX, TROPONINI in the last 168 hours. BNP: BNP (last 3 results) No results for input(s): BNP in the last 8760 hours.  ProBNP (last 3 results) No results for input(s): PROBNP in the last 8760 hours.  CBG: Recent Labs  Lab 09/20/18 0009  GLUCAP 90       Signed:  Florencia Reasons MD, PhD  [facility] 09/22/2018, 10:23 AM

## 2018-09-22 NOTE — Progress Notes (Signed)
Discharge instructions and medications discussed with patient & daughter.  AVS given to patient.  All questions answered.

## 2018-09-22 NOTE — Progress Notes (Signed)
Pt assessment noted - will monitor

## 2018-09-27 ENCOUNTER — Telehealth: Payer: Self-pay

## 2018-09-27 ENCOUNTER — Other Ambulatory Visit: Payer: Self-pay

## 2018-09-27 ENCOUNTER — Inpatient Hospital Stay: Payer: Medicare Other | Attending: Hematology and Oncology | Admitting: Hematology and Oncology

## 2018-09-27 ENCOUNTER — Inpatient Hospital Stay: Payer: Medicare Other

## 2018-09-27 ENCOUNTER — Telehealth: Payer: Self-pay | Admitting: Hematology and Oncology

## 2018-09-27 ENCOUNTER — Telehealth: Payer: Self-pay | Admitting: Pharmacist

## 2018-09-27 VITALS — BP 122/70 | HR 104 | Temp 97.9°F | Ht 68.0 in | Wt 139.7 lb

## 2018-09-27 DIAGNOSIS — C50919 Malignant neoplasm of unspecified site of unspecified female breast: Secondary | ICD-10-CM

## 2018-09-27 DIAGNOSIS — C787 Secondary malignant neoplasm of liver and intrahepatic bile duct: Secondary | ICD-10-CM | POA: Diagnosis not present

## 2018-09-27 DIAGNOSIS — Z9181 History of falling: Secondary | ICD-10-CM

## 2018-09-27 DIAGNOSIS — R911 Solitary pulmonary nodule: Secondary | ICD-10-CM

## 2018-09-27 DIAGNOSIS — R410 Disorientation, unspecified: Secondary | ICD-10-CM

## 2018-09-27 DIAGNOSIS — E860 Dehydration: Secondary | ICD-10-CM | POA: Diagnosis not present

## 2018-09-27 DIAGNOSIS — R52 Pain, unspecified: Secondary | ICD-10-CM

## 2018-09-27 DIAGNOSIS — R599 Enlarged lymph nodes, unspecified: Secondary | ICD-10-CM | POA: Diagnosis not present

## 2018-09-27 DIAGNOSIS — N179 Acute kidney failure, unspecified: Secondary | ICD-10-CM | POA: Diagnosis not present

## 2018-09-27 LAB — CMP (CANCER CENTER ONLY)
ALK PHOS: 406 U/L — AB (ref 38–126)
ALT: 54 U/L — AB (ref 0–44)
AST: 160 U/L — ABNORMAL HIGH (ref 15–41)
Albumin: 2.5 g/dL — ABNORMAL LOW (ref 3.5–5.0)
Anion gap: 12 (ref 5–15)
BILIRUBIN TOTAL: 1.1 mg/dL (ref 0.3–1.2)
BUN: 27 mg/dL — ABNORMAL HIGH (ref 8–23)
CALCIUM: 11.3 mg/dL — AB (ref 8.9–10.3)
CO2: 23 mmol/L (ref 22–32)
CREATININE: 2.11 mg/dL — AB (ref 0.44–1.00)
Chloride: 105 mmol/L (ref 98–111)
GFR, Est AFR Am: 26 mL/min — ABNORMAL LOW (ref >60)
GFR, Estimated: 23 mL/min — ABNORMAL LOW (ref >60)
Glucose, Bld: 119 mg/dL — ABNORMAL HIGH (ref 70–99)
Potassium: 4.3 mmol/L (ref 3.5–5.1)
SODIUM: 140 mmol/L (ref 135–145)
TOTAL PROTEIN: 6.5 g/dL (ref 6.5–8.1)

## 2018-09-27 LAB — CBC WITH DIFFERENTIAL (CANCER CENTER ONLY)
Abs Immature Granulocytes: 0.02 10*3/uL (ref 0.00–0.07)
BASOS PCT: 1 %
Basophils Absolute: 0 10*3/uL (ref 0.0–0.1)
EOS ABS: 0.1 10*3/uL (ref 0.0–0.5)
EOS PCT: 2 %
HEMATOCRIT: 43.1 % (ref 36.0–46.0)
Hemoglobin: 13.4 g/dL (ref 12.0–15.0)
Immature Granulocytes: 0 %
LYMPHS ABS: 0.8 10*3/uL (ref 0.7–4.0)
Lymphocytes Relative: 17 %
MCH: 30.7 pg (ref 26.0–34.0)
MCHC: 31.1 g/dL (ref 30.0–36.0)
MCV: 98.6 fL (ref 80.0–100.0)
MONOS PCT: 10 %
Monocytes Absolute: 0.5 10*3/uL (ref 0.1–1.0)
NRBC: 0 % (ref 0.0–0.2)
Neutro Abs: 3.2 10*3/uL (ref 1.7–7.7)
Neutrophils Relative %: 70 %
PLATELETS: 177 10*3/uL (ref 150–400)
RBC: 4.37 MIL/uL (ref 3.87–5.11)
RDW: 14.7 % (ref 11.5–15.5)
WBC Count: 4.5 10*3/uL (ref 4.0–10.5)

## 2018-09-27 LAB — URINALYSIS, COMPLETE (UACMP) WITH MICROSCOPIC
Bilirubin Urine: NEGATIVE
GLUCOSE, UA: NEGATIVE mg/dL
HGB URINE DIPSTICK: NEGATIVE
KETONES UR: NEGATIVE mg/dL
NITRITE: NEGATIVE
PH: 5 (ref 5.0–8.0)
Protein, ur: NEGATIVE mg/dL
SPECIFIC GRAVITY, URINE: 1.015 (ref 1.005–1.030)

## 2018-09-27 LAB — AMMONIA: Ammonia: 45 umol/L — ABNORMAL HIGH (ref 9–35)

## 2018-09-27 MED ORDER — PALBOCICLIB 125 MG PO CAPS: 125.0000 mg | ORAL_CAPSULE | Freq: Every day | ORAL | 3 refills | Status: DC

## 2018-09-27 MED ORDER — OXYCODONE HCL 5 MG PO TABS: 5.0000 mg | ORAL_TABLET | Freq: Four times a day (QID) | ORAL | 0 refills | Status: DC | PRN

## 2018-09-27 MED ORDER — LETROZOLE 2.5 MG PO TABS: 2.5000 mg | ORAL_TABLET | Freq: Every day | ORAL | 3 refills | Status: DC

## 2018-09-27 MED ORDER — FENTANYL 25 MCG/HR TD PT72: 25.0000 ug | MEDICATED_PATCH | TRANSDERMAL | 0 refills | Status: DC

## 2018-09-27 NOTE — Progress Notes (Signed)
Patient Care Team: Stephens Shire, MD as PCP - General (Family Medicine)  DIAGNOSIS:  Encounter Diagnosis  Name Primary?  . Metastatic breast cancer ([place]) Yes    SUMMARY OF ONCOLOGIC HISTORY:   Metastatic breast cancer (Chey[place] Mckenzie)   09/21/2018 Initial Diagnosis    Liver biopsy: Metastatic breast cancer ER 100%, PR 2% strong, HER-2 equivocal FISH pending, Ki-67 20%     CHIEF COMPLIANT: Follow-up to discuss results of the liver biopsy from recent hospitalization  INTERVAL HISTORY: Cheyenne Mckenzie is a 67 year old with above-mentioned some metastatic breast cancer who underwent liver biopsy and is here today to discuss the pathology report.  She is extremely frail and weak and in a wheelchair.  She apparently fell down yesterday and was taken to the emergency department.  She was sent back home and to follow-up with [place].  She has not been eating or drinking much and has poor appetite.  She is brought in by HER-2 caregivers.  She is also complaining of liver capsular pain.  She apparently cries for help and she gets pain medications.  Sometimes the pain lasts all day.  REVIEW OF SYSTEMS:   Constitutional: Denies fevers, chills or abnormal weight loss Eyes: Denies blurriness of vision Ears, nose, mouth, throat, and face: Denies mucositis or sore throat Respiratory: Denies cough, dyspnea or wheezes Cardiovascular: Denies palpitation, chest discomfort Gastrointestinal abdominal distention and hepatomegaly Skin: Denies abnormal skin rashes Lymphatics: Denies new lymphadenopathy or easy bruising Neurological:Denies numbness, tingling or new weaknesses Behavioral/Psych: Mood is stable, no new changes  Extremities: No lower extremity edema  All other systems were reviewed with the patient and are negative.  I have reviewed the past medical history, past surgical history, social history and family history with the patient and they are unchanged from previous note.  ALLERGIES:  is allergic to  erythromycin base and sulfamethoxazole.  MEDICATIONS:  Current Outpatient Medications  Medication Sig Dispense Refill  . ALPRAZolam (XANAX) 0.5 MG tablet Take 0.5 mg by mouth 2 (two) times daily.    Marland Kitchen amphetamine-dextroamphetamine (ADDERALL) 30 MG tablet Take 30 mg by mouth 2 (two) times daily.  0  . fentaNYL (DURAGESIC - DOSED MCG/HR) 25 MCG/HR patch Place 1 patch (25 mcg total) onto the skin every 3 (three) days. 10 patch 0  . letrozole (FEMARA) 2.5 MG tablet Take 1 tablet (2.5 mg total) by mouth daily. 90 tablet 3  . levothyroxine (SYNTHROID, LEVOTHROID) 137 MCG tablet Take 137 mcg by mouth daily.  1  . oxyCODONE (OXY IR/ROXICODONE) 5 MG immediate release tablet Take 1 tablet (5 mg total) by mouth every 6 (six) hours as needed for moderate pain. 30 tablet 0  . palbociclib (IBRANCE) 125 MG capsule Take 1 capsule (125 mg total) by mouth daily with breakfast. Take whole with food. Take for 21 days on, 7 days off, repeat every 28 days. 21 capsule 3  . PARoxetine (PAXIL) 40 MG tablet Take 40 mg by mouth daily.  1  . senna-docusate (SENOKOT-S) 8.6-50 MG tablet Take 1 tablet by mouth at bedtime. 30 tablet 0   No current facility-administered medications for this visit.     PHYSICAL EXAMINATION: ECOG PERFORMANCE STATUS: 2 - Symptomatic, <50% confined to bed  Vitals:   09/27/18 1359  BP: 122/70  Pulse: (!) 104  Temp: 97.9 F (36.6 C)  SpO2: 98%   Filed Weights   09/27/18 1359  Weight: 139 lb 11.2 oz (63.4 kg)    GENERAL:alert, no distress and comfortable SKIN: skin  color, texture, turgor are normal, no rashes or significant lesions EYES: normal, Conjunctiva are pink and non-injected, sclera clear OROPHARYNX:no exudate, no erythema and lips, buccal mucosa, and tongue normal  NECK: supple, thyroid normal size, non-tender, without nodularity LYMPH:  no palpable lymphadenopathy in the cervical, axillary or inguinal LUNGS: clear to auscultation and percussion with normal breathing  effort HEART: regular rate & rhythm and no murmurs and no lower extremity edema ABDOMEN: Mildly distended abdomen with enlarged liver MUSCULOSKELETAL:no cyanosis of digits and no clubbing  NEURO: alert & oriented x 3 with fluent speech, no focal motor/sensory deficits EXTREMITIES: No lower extremity edema    LABORATORY DATA:  I have reviewed the data as listed CMP Latest Ref Rng & Units 09/27/2018 09/22/2018 09/21/2018  Glucose 70 - 99 mg/dL 119(H) 71 78  BUN 8 - 23 mg/dL 27(H) 27(H) 31(H)  Creatinine 0.44 - 1.00 mg/dL 2.11(H) 1.47(H) 1.81(H)  Sodium 135 - 145 mmol/L 140 137 138  Potassium 3.5 - 5.1 mmol/L 4.3 4.4 4.5  Chloride 98 - 111 mmol/L 105 109 111  CO2 22 - 32 mmol/L 23 19(L) 19(L)  Calcium 8.9 - 10.3 mg/dL 11.3(H) 9.6 9.6  Total Protein 6.5 - 8.1 g/dL 6.5 5.7(L) 6.1(L)  Total Bilirubin 0.3 - 1.2 mg/dL 1.1 1.5(H) 1.3(H)  Alkaline Phos 38 - 126 U/L 406(H) 271(H) 281(H)  AST 15 - 41 U/L 160(H) 121(H) 150(H)  ALT 0 - 44 U/L 54(H) 53(H) 63(H)    Lab Results  Component Value Date   WBC 4.5 09/27/2018   HGB 13.4 09/27/2018   HCT 43.1 09/27/2018   MCV 98.6 09/27/2018   PLT 177 09/27/2018   NEUTROABS 3.2 09/27/2018    ASSESSMENT & PLAN:  Metastatic breast cancer ([place]) Metastatic breast cancer with liver metastases, breast tumor 3.9 cm with calcifications and enlarged right axillary lymph nodes.  Small lung nodules bilaterally uncertain significance.  Liver biopsy: Metastatic breast cancer ER positive PR positive HER-2 equivocal FISH pending  Treatment plan: Ibrance with letrozole started 09/27/2018 Ibrance: I discussed the risks and benefits of Ibrance including myelosuppression especially neutropenia and with that risk of infection, there is risk of pulmonary embolism and mild peripheral neuropathy as well. Fatigue, nausea, diarrhea, decreased appetite as well as alopecia and thrombocytopenia are also potential side effects of Ibrance  Letrozole counseling: We discussed the  risks and benefits of anti-estrogen therapy with aromatase inhibitors. These include but not limited to insomnia, hot flashes, mood changes, vaginal dryness, bone density loss, and weight gain. We strongly believe that the benefits far outweigh the risks. Patient understands these risks and consented to starting treatment.    I discussed with the patient the goals of treatment and palliation.  She is extremely frail and weak because of metastatic disease.  If she responds well to treatment and certainly that can improve her energy levels and performance status.  We will have to monitor the blood counts and LFTs very closely. I discussed with the patient that if she does not want to eat and drink and increase her nourishment, there is no point in doing systemic therapy.  She assured me that she will strive to drink more liquids and eat more protein.  Return to clinic in 2 weeks with labs and follow-up    No orders of the defined types were placed in this encounter.  The patient has a good understanding of the overall plan. she agrees with it. she will call with any problems that may develop before the next  visit here.   Harriette Ohara, MD 09/27/18

## 2018-09-27 NOTE — Telephone Encounter (Signed)
Oral Oncology Pharmacist Encounter  Received new prescription for Ibrance (palbociclib) for the treatment of hormone receptor positive, metastatic breast cancer in conjunction with Femara, planned duration until disease progression or unacceptable toxicity.  Labs from Epic assessed  11/1 SCr= 1.47, est CrCl ~ 35 mL/min 10/31 SCr=1.81, est CrCl ~ 30 mL/min No dose adjustment per manufacturer for renal dysfunction  11/1 Tbili=1.5, LFTs 2-3x ULN, patient with known liver metastases 10/30 INR 1.20 No dose adjustment per manufacturer for Child Pugh classes A and B liver dysfunction  Cheyenne Mckenzie will be dosed at 125 mg by mouth once daily given with food for 3 weeks on, one week off, repeated every 4 weeks.  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  Patient will be counseled about possible increase risk of somnolence with the addition of Ibrance.  No change to current therapy is indicated at this time.  Prescription has been e-scribed to the [facility] for benefits analysis and approval.  Oral Oncology Clinic will continue to follow for insurance authorization, copayment issues, initial counseling and start date.  Johny Drilling, PharmD, BCPS, BCOP  09/27/2018 2:36 PM Oral Oncology Clinic [phone]

## 2018-09-27 NOTE — Assessment & Plan Note (Signed)
Metastatic breast cancer with liver metastases, breast tumor 3.9 cm with calcifications and enlarged right axillary lymph nodes.  Small lung nodules bilaterally uncertain significance.  Liver biopsy: Metastatic breast cancer ER positive PR positive HER-2 equivocal FISH pending  Treatment plan: Ibrance with letrozole started 09/27/2018 Ibrance: I discussed the risks and benefits of Ibrance including myelosuppression especially neutropenia and with that risk of infection, there is risk of pulmonary embolism and mild peripheral neuropathy as well. Fatigue, nausea, diarrhea, decreased appetite as well as alopecia and thrombocytopenia are also potential side effects of Ibrance  Letrozole counseling: We discussed the risks and benefits of anti-estrogen therapy with aromatase inhibitors. These include but not limited to insomnia, hot flashes, mood changes, vaginal dryness, bone density loss, and weight gain. We strongly believe that the benefits far outweigh the risks. Patient understands these risks and consented to starting treatment.    I discussed with the patient the goals of treatment and palliation.  She is extremely frail and weak because of metastatic disease.  If she responds well to treatment and certainly that can improve her energy levels and performance status.  Return to clinic in 2 weeks with labs and follow-up

## 2018-09-27 NOTE — Telephone Encounter (Signed)
Oral Chemotherapy Pharmacist Encounter   I spoke with patient, roommate [name], and sister [name], in exam room for overview of: Ibrance (palbociclib) for the treatment of hormone receptor positive, metastatic breast cancer in conjunction with Femara, planned duration until disease progression or unacceptable toxicity.   Counseled patient on administration, dosing, side effects, monitoring, drug-food interactions, safe handling, storage, and disposal.  Patient will take Ibrance 125 mg capsules, 1 capsule by mouth once daily with food for 3 weeks on, 1 week off.  Patient knows to avoid grapefruit and grapefruit juice.  Patient is taking Femara once daily with breakfast.  Ibrance start date: 09/28/2018  Adverse effects include but are not limited to: fatigue, hair loss, GI upset, nausea, decreased blood counts, and increased upper respiratory infections. Severe, life-threatening, and/or fatal interstitial lung disease (ILD) and/or pneumonitis may occur with CDK 4/6 inhibitors.  Patient will obtain anti diarrheal and alert the office of 4 or more loose stools above baseline.  Patient reminded of WBC check on Cycle 1 Day 14 for dose and ANC assessment. This is scheduled for 10/16/18  Reviewed with patient importance of keeping a medication schedule and plan for any missed doses.  Ms. Egler voiced understanding and appreciation.   All questions answered. Medication reconciliation performed and medication/allergy list updated.  Dispensed samples to patient in office today so that she may start on therapy immediately.  Medication: Ibrance 125 mg capsules Instructions: Take 1 capsule (125 mg) by mouth once daily with food, take for 3 weeks on, one week off, repeat every 4 weeks Quantity dispensed: 21 Days supply: 28 Manufacturer: Pfizer Lot: [id number] Exp: 02/2019  Will follow up with patient regarding insurance and pharmacy once insurance authorization is obtained.   Patient knows to  call the office with questions or concerns. Oral Oncology Clinic will continue to follow.  Johny Drilling, PharmD, BCPS, BCOP  09/27/2018   2:47 PM Oral Oncology Clinic [phone]

## 2018-09-27 NOTE — Telephone Encounter (Signed)
Pt friend called and reported pt had been very confused, combative, and uncompliant with taking her home medication. Pt decreased appetite and had fallen on the floor yesterday. Pt friend called 911 to come help pt to get off the floor, but did not proceed to take her to the ED, since pt will be seen by our office today. Pt also has dark brown urine output.   Asked if pt could come in the cancer center early for lab work. Will work up for ammonia level, cbc, cmet and urine cultures to be drawn. Discussed with Dr.Gudena and agrees with ordering these labs today. Pt friend, [name], will be with pt, as well as pt sister.

## 2018-09-27 NOTE — Telephone Encounter (Signed)
Printed calendar and avs. °

## 2018-09-28 ENCOUNTER — Emergency Department (HOSPITAL_COMMUNITY): Payer: Medicare Other

## 2018-09-28 ENCOUNTER — Telehealth: Payer: Self-pay

## 2018-09-28 ENCOUNTER — Inpatient Hospital Stay (HOSPITAL_COMMUNITY)
Admission: EM | Admit: 2018-09-28 | Discharge: 2018-10-05 | DRG: 682 | Disposition: A | Discharge status: Hospice | Payer: Medicare Other | Attending: Internal Medicine | Admitting: Internal Medicine

## 2018-09-28 ENCOUNTER — Other Ambulatory Visit: Payer: Self-pay

## 2018-09-28 ENCOUNTER — Encounter (HOSPITAL_COMMUNITY): Payer: Self-pay

## 2018-09-28 ENCOUNTER — Telehealth: Payer: Self-pay | Admitting: Pharmacist

## 2018-09-28 DIAGNOSIS — E039 Hypothyroidism, unspecified: Secondary | ICD-10-CM | POA: Diagnosis present

## 2018-09-28 DIAGNOSIS — Z79811 Long term (current) use of aromatase inhibitors: Secondary | ICD-10-CM

## 2018-09-28 DIAGNOSIS — G9341 Metabolic encephalopathy: Secondary | ICD-10-CM | POA: Diagnosis not present

## 2018-09-28 DIAGNOSIS — K7290 Hepatic failure, unspecified without coma: Secondary | ICD-10-CM | POA: Diagnosis present

## 2018-09-28 DIAGNOSIS — F418 Other specified anxiety disorders: Secondary | ICD-10-CM | POA: Diagnosis present

## 2018-09-28 DIAGNOSIS — G9340 Encephalopathy, unspecified: Secondary | ICD-10-CM

## 2018-09-28 DIAGNOSIS — C787 Secondary malignant neoplasm of liver and intrahepatic bile duct: Secondary | ICD-10-CM | POA: Diagnosis present

## 2018-09-28 DIAGNOSIS — F17200 Nicotine dependence, unspecified, uncomplicated: Secondary | ICD-10-CM | POA: Diagnosis present

## 2018-09-28 DIAGNOSIS — Z881 Allergy status to other antibiotic agents status: Secondary | ICD-10-CM

## 2018-09-28 DIAGNOSIS — C50919 Malignant neoplasm of unspecified site of unspecified female breast: Secondary | ICD-10-CM | POA: Diagnosis present

## 2018-09-28 DIAGNOSIS — R627 Adult failure to thrive: Secondary | ICD-10-CM | POA: Diagnosis present

## 2018-09-28 DIAGNOSIS — F0390 Unspecified dementia without behavioral disturbance: Secondary | ICD-10-CM | POA: Diagnosis present

## 2018-09-28 DIAGNOSIS — F988 Other specified behavioral and emotional disorders with onset usually occurring in childhood and adolescence: Secondary | ICD-10-CM | POA: Diagnosis present

## 2018-09-28 DIAGNOSIS — R7989 Other specified abnormal findings of blood chemistry: Secondary | ICD-10-CM | POA: Diagnosis present

## 2018-09-28 DIAGNOSIS — N183 Chronic kidney disease, stage 3 (moderate): Secondary | ICD-10-CM | POA: Diagnosis present

## 2018-09-28 DIAGNOSIS — I129 Hypertensive chronic kidney disease with stage 1 through stage 4 chronic kidney disease, or unspecified chronic kidney disease: Secondary | ICD-10-CM | POA: Diagnosis present

## 2018-09-28 DIAGNOSIS — M25511 Pain in right shoulder: Secondary | ICD-10-CM | POA: Diagnosis not present

## 2018-09-28 DIAGNOSIS — Z515 Encounter for palliative care: Secondary | ICD-10-CM | POA: Diagnosis present

## 2018-09-28 DIAGNOSIS — I10 Essential (primary) hypertension: Secondary | ICD-10-CM | POA: Diagnosis present

## 2018-09-28 DIAGNOSIS — R531 Weakness: Secondary | ICD-10-CM

## 2018-09-28 DIAGNOSIS — N179 Acute kidney failure, unspecified: Principal | ICD-10-CM

## 2018-09-28 DIAGNOSIS — C50911 Malignant neoplasm of unspecified site of right female breast: Secondary | ICD-10-CM | POA: Diagnosis present

## 2018-09-28 DIAGNOSIS — Z66 Do not resuscitate: Secondary | ICD-10-CM | POA: Diagnosis not present

## 2018-09-28 DIAGNOSIS — E861 Hypovolemia: Secondary | ICD-10-CM | POA: Diagnosis present

## 2018-09-28 DIAGNOSIS — R740 Nonspecific elevation of levels of transaminase and lactic acid dehydrogenase [LDH]: Secondary | ICD-10-CM | POA: Diagnosis present

## 2018-09-28 DIAGNOSIS — E860 Dehydration: Secondary | ICD-10-CM

## 2018-09-28 DIAGNOSIS — R945 Abnormal results of liver function studies: Secondary | ICD-10-CM

## 2018-09-28 DIAGNOSIS — Z882 Allergy status to sulfonamides status: Secondary | ICD-10-CM

## 2018-09-28 DIAGNOSIS — G2581 Restless legs syndrome: Secondary | ICD-10-CM | POA: Diagnosis present

## 2018-09-28 DIAGNOSIS — R1310 Dysphagia, unspecified: Secondary | ICD-10-CM | POA: Diagnosis present

## 2018-09-28 DIAGNOSIS — S40022A Contusion of left upper arm, initial encounter: Secondary | ICD-10-CM | POA: Diagnosis present

## 2018-09-28 DIAGNOSIS — L8990 Pressure ulcer of unspecified site, unspecified stage: Secondary | ICD-10-CM

## 2018-09-28 DIAGNOSIS — R32 Unspecified urinary incontinence: Secondary | ICD-10-CM | POA: Diagnosis present

## 2018-09-28 DIAGNOSIS — W06XXXA Fall from bed, initial encounter: Secondary | ICD-10-CM | POA: Diagnosis present

## 2018-09-28 DIAGNOSIS — R4781 Slurred speech: Secondary | ICD-10-CM | POA: Diagnosis not present

## 2018-09-28 DIAGNOSIS — R0600 Dyspnea, unspecified: Secondary | ICD-10-CM

## 2018-09-28 DIAGNOSIS — Z79899 Other long term (current) drug therapy: Secondary | ICD-10-CM

## 2018-09-28 DIAGNOSIS — L89619 Pressure ulcer of right heel, unspecified stage: Secondary | ICD-10-CM | POA: Diagnosis present

## 2018-09-28 DIAGNOSIS — X58XXXA Exposure to other specified factors, initial encounter: Secondary | ICD-10-CM | POA: Diagnosis present

## 2018-09-28 DIAGNOSIS — S21001A Unspecified open wound of right breast, initial encounter: Secondary | ICD-10-CM | POA: Diagnosis present

## 2018-09-28 DIAGNOSIS — Z79891 Long term (current) use of opiate analgesic: Secondary | ICD-10-CM

## 2018-09-28 DIAGNOSIS — G92 Toxic encephalopathy: Secondary | ICD-10-CM | POA: Diagnosis present

## 2018-09-28 DIAGNOSIS — Y92238 Other place in hospital as the place of occurrence of the external cause: Secondary | ICD-10-CM | POA: Diagnosis present

## 2018-09-28 DIAGNOSIS — I69398 Other sequelae of cerebral infarction: Secondary | ICD-10-CM

## 2018-09-28 LAB — COMPREHENSIVE METABOLIC PANEL
ALK PHOS: 324 U/L — AB (ref 38–126)
ALT: 48 U/L — AB (ref 0–44)
AST: 140 U/L — ABNORMAL HIGH (ref 15–41)
Albumin: 2.9 g/dL — ABNORMAL LOW (ref 3.5–5.0)
Anion gap: 11 (ref 5–15)
BUN: 32 mg/dL — ABNORMAL HIGH (ref 8–23)
CALCIUM: 11.2 mg/dL — AB (ref 8.9–10.3)
CO2: 23 mmol/L (ref 22–32)
CREATININE: 2.5 mg/dL — AB (ref 0.44–1.00)
Chloride: 105 mmol/L (ref 98–111)
GFR, EST AFRICAN AMERICAN: 21 mL/min — AB (ref >60)
GFR, EST NON AFRICAN AMERICAN: 19 mL/min — AB (ref >60)
Glucose, Bld: 88 mg/dL (ref 70–99)
Potassium: 4.5 mmol/L (ref 3.5–5.1)
Sodium: 139 mmol/L (ref 135–145)
Total Bilirubin: 1 mg/dL (ref 0.3–1.2)
Total Protein: 6.7 g/dL (ref 6.5–8.1)

## 2018-09-28 LAB — I-STAT TROPONIN, ED: TROPONIN I, POC: 0.02 ng/mL (ref 0.00–0.08)

## 2018-09-28 LAB — CBC WITH DIFFERENTIAL/PLATELET
ABS IMMATURE GRANULOCYTES: 0.01 10*3/uL (ref 0.00–0.07)
Basophils Absolute: 0.1 10*3/uL (ref 0.0–0.1)
Basophils Relative: 1 %
Eosinophils Absolute: 0.1 10*3/uL (ref 0.0–0.5)
Eosinophils Relative: 3 %
HCT: 42.7 % (ref 36.0–46.0)
HEMOGLOBIN: 12.9 g/dL (ref 12.0–15.0)
Immature Granulocytes: 0 %
LYMPHS PCT: 18 %
Lymphs Abs: 0.9 10*3/uL (ref 0.7–4.0)
MCH: 30 pg (ref 26.0–34.0)
MCHC: 30.2 g/dL (ref 30.0–36.0)
MCV: 99.3 fL (ref 80.0–100.0)
MONO ABS: 0.5 10*3/uL (ref 0.1–1.0)
MONOS PCT: 11 %
NEUTROS ABS: 3.1 10*3/uL (ref 1.7–7.7)
Neutrophils Relative %: 67 %
Platelets: 199 10*3/uL (ref 150–400)
RBC: 4.3 MIL/uL (ref 3.87–5.11)
RDW: 14.7 % (ref 11.5–15.5)
WBC: 4.6 10*3/uL (ref 4.0–10.5)
nRBC: 0 % (ref 0.0–0.2)

## 2018-09-28 LAB — AMMONIA: Ammonia: 38 umol/L — ABNORMAL HIGH (ref 9–35)

## 2018-09-28 LAB — URINE CULTURE: CULTURE: NO GROWTH

## 2018-09-28 MED ORDER — SODIUM CHLORIDE 0.9 % IV BOLUS
1000.0000 mL | Freq: Once | INTRAVENOUS | Status: AC
  Administered 2018-09-28: 1000 mL via INTRAVENOUS

## 2018-09-28 NOTE — Telephone Encounter (Signed)
Oral Oncology Patient Advocate Encounter  During my benefits investigation I found that the patient has no prescription insurance coverage. I called the patient and her roommate answered, she advised me to call the patients sister Cheyenne Mckenzie [phone].   I called Cheyenne Mckenzie and she was sure that Cheyenne Mckenzie does not have prescription insurance, only Medicare A/B. I explained to her that we could do a Pf[employer] patient assistance application, we would need income information and the patients signature. Cheyenne Mckenzie is going to look for her SSI letter. Cheyenne Mckenzie will be coming in on 11/25 and we can discuss further and have her sign at that time. We have samples and a voucher that she can use in the meantime if needed. Cheyenne Mckenzie asked about help with Letrozole and I gave her Cheyenne Mckenzie's number.  Cheyenne Mckenzie verbalized understanding and great appreciation of this.  [place] [facility] Phone [phone] Fax [phone]

## 2018-09-28 NOTE — ED Notes (Addendum)
Pt is becoming increasingly confused and stating "I was told I can leave. I dont need to be here anymore." Pt is attempting to remove all cardiac equipment as well as IV. Pt has been redirected multiple times. Family at bedside. MD notified and stated she will come speak to pt and family

## 2018-09-28 NOTE — ED Notes (Signed)
Bed: WA17 Expected date:  Expected time:  Means of arrival:  Comments: [facility] triage

## 2018-09-28 NOTE — ED Provider Notes (Signed)
[facility] DEPT Provider Note   CSN: [id number] Arrival date & time: 09/28/18  1854     History   Chief Complaint Chief Complaint  Patient presents with  . Weakness    HPI Cheyenne Mckenzie is a 67 y.o. female.  67 y/o female with a PMH ADD, Metastatic breast CA, Acute renal failure presents to the ED with a chief complaint of weakness since yesterday. Patient was started on Ibrance for the treatment of hormone receptor positive, metastatic breast cancer in conjunction with Femara by Dr. Lindi Adie for her metastic disease. Per roommate [name], patient has been weak, had decreased in liquid intake along non ambulatory since returning home from the hospital. Patient had blood work drawn yesterday and was told her "kidney levels along with liver enzyme levels were abnormal".  Per patient's record is called by the oral oncology pharmacist today and advised to seen in the ED for fluids along with medication.  Per patient remarked [name] "patient's demeanor as increasingly confused, incoherent, extremely lethargic, with urine that is the color of coffee.".  Interviewing patient she reports no complaints but believes she is at [name] and her aunt is her sister.   The history is provided by the patient, a caregiver, a relative and medical records.    Past Medical History:  Diagnosis Date  . ADD (attention deficit disorder)   . Anxiety   . Depression   . Essential hypertension   . RLS (restless legs syndrome)   . Thyroid disease   . Tobacco abuse     Patient Active Problem List   Diagnosis Date Noted  . Acute encephalopathy 09/28/2018  . Metastatic breast cancer ([place])   . Depression with anxiety 09/20/2018  . Hypothyroidism 09/20/2018  . Acute renal failure superimposed on stage 3 chronic kidney disease ([place]) 09/20/2018  . Abnormal LFTs 09/20/2018  . Liver masses 09/20/2018  . Hypercalcemia 09/20/2018  . GERD (gastroesophageal reflux disease) 09/20/2018   . ADD (attention deficit disorder)   . Essential hypertension   . Dehydration   . Elevated liver function tests   . Tobacco abuse     Past Surgical History:  Procedure Laterality Date  . KNEE SURGERY Left   . WRIST SURGERY Right      OB History   None      Home Medications    Prior to Admission medications   Medication Sig Start Date End Date Taking? Authorizing Provider  ALPRAZolam Duanne Moron) 0.5 MG tablet Take 0.5 mg by mouth 2 (two) times daily.   Yes [provider]  amphetamine-dextroamphetamine (ADDERALL) 30 MG tablet Take 30 mg by mouth 2 (two) times daily. 09/07/18  Yes [provider]  aspirin-acetaminophen-caffeine (EXCEDRIN MIGRAINE) [phone] MG tablet Take 2 tablets by mouth every 6 (six) hours as needed for headache.   Yes [provider]  letrozole (FEMARA) 2.5 MG tablet Take 1 tablet (2.5 mg total) by mouth daily. 09/27/18  Yes Nicholas Lose, MD  levothyroxine (SYNTHROID, LEVOTHROID) 137 MCG tablet Take 137 mcg by mouth daily. 08/21/18  Yes [provider]  oxyCODONE (OXY IR/ROXICODONE) 5 MG immediate release tablet Take 1 tablet (5 mg total) by mouth every 6 (six) hours as needed for moderate pain. 09/27/18  Yes Nicholas Lose, MD  palbociclib Leslee Home) 125 MG capsule Take 1 capsule (125 mg total) by mouth daily with breakfast. Take whole with food. Take for 21 days on, 7 days off, repeat every 28 days. 09/27/18  Yes Nicholas Lose, MD  PARoxetine (PAXIL) 40 MG tablet Take 40 mg by mouth daily. 06/26/18  Yes [provider]  fentaNYL (DURAGESIC - DOSED MCG/HR) 25 MCG/HR patch Place 1 patch (25 mcg total) onto the skin every 3 (three) days. 09/27/18   Nicholas Lose, MD  senna-docusate (SENOKOT-S) 8.6-50 MG tablet Take 1 tablet by mouth at bedtime. Patient taking differently: Take 1 tablet by mouth at bedtime as needed for mild constipation.  09/22/18   Florencia Reasons, MD    Family History Family History  Problem Relation Age of Onset    . Cancer Mother        Mother died of cancer, not know which type of cancer  . Cancer Father        Father died of cancer, not know which type of cancer.    Social History Social History   Tobacco Use  . Smoking status: Current Every Day Smoker  . Smokeless tobacco: Never Used  Substance Use Topics  . Alcohol use: Not Currently  . Drug use: Never     Allergies   Erythromycin base and Sulfamethoxazole   Review of Systems Review of Systems  Constitutional: Negative for fever.  HENT: Negative for sore throat.   Respiratory: Negative for shortness of breath.   Cardiovascular: Negative for chest pain.  Gastrointestinal: Negative for abdominal pain, diarrhea, nausea and vomiting.  Genitourinary: Negative for dysuria and flank pain.  Musculoskeletal: Negative for back pain.  Skin: Negative for pallor and wound.  Neurological: Negative for headaches.  Psychiatric/Behavioral: Positive for confusion and decreased concentration.     Physical Exam Updated Vital Signs BP (!) 148/102 (BP Location: Left Arm)   Pulse 80   Temp 97.8 F (36.6 C) (Oral)   Resp (!) 21   Ht 5\' 8"  (1.727 m)   Wt 63.4 kg   SpO2 94%   BMI 21.24 kg/m   Physical Exam  Constitutional: She is oriented to person, place, and time. She appears well-developed and well-nourished. No distress.  HENT:  Head: Normocephalic and atraumatic.  Mouth/Throat: Oropharynx is clear and moist. No oropharyngeal exudate or posterior oropharyngeal erythema.  No scalp tenderness or bitemporal tenderness on exam.   Eyes: Pupils are equal, round, and reactive to light. Right conjunctiva is not injected. Right conjunctiva has no hemorrhage. Left conjunctiva is not injected. Left conjunctiva has no hemorrhage. Right pupil is round and reactive. Left pupil is round and reactive. Pupils are equal.  Neck: Normal range of motion.  Cardiovascular: Regular rhythm and normal heart sounds.  Pulmonary/Chest: Effort normal and breath  sounds normal. No respiratory distress.  Abdominal: Soft. Bowel sounds are normal. She exhibits no distension. There is no tenderness.  Musculoskeletal: She exhibits no tenderness or deformity.       Right lower leg: She exhibits no edema.       Left lower leg: She exhibits no edema.  Neurological: She is alert and oriented to person, place, and time.  AO X1   Skin: Skin is warm and dry.  Psychiatric: She has a normal mood and affect.  Nursing note and vitals reviewed.    ED Treatments / Results  Labs (all labs ordered are listed, but only abnormal results are displayed) Labs Reviewed  COMPREHENSIVE METABOLIC PANEL - Abnormal; Notable for the following components:      Result Value   BUN 32 (*)    Creatinine, Ser 2.50 (*)    Calcium 11.2 (*)    Albumin 2.9 (*)    AST 140 (*)  ALT 48 (*)    Alkaline Phosphatase 324 (*)    GFR calc non Af Amer 19 (*)    GFR calc Af Amer 21 (*)    All other components within normal limits  AMMONIA - Abnormal; Notable for the following components:   Ammonia 38 (*)    All other components within normal limits  CBC WITH DIFFERENTIAL/PLATELET  URINALYSIS, ROUTINE W REFLEX MICROSCOPIC  I-STAT TROPONIN, ED    EKG None  Radiology Dg Chest 2 View  Result Date: 09/28/2018 CLINICAL DATA:  Altered mental status EXAM: CHEST - 2 VIEW COMPARISON:  09/20/2018 FINDINGS: Trace pleural effusions on the lateral view. Mild cardiomegaly. No acute consolidation. No pneumothorax. IMPRESSION: Cardiomegaly.  Trace pleural effusions. Electronically Signed   By: Donavan Foil M.D.   On: 09/28/2018 20:53   Ct Head Wo Contrast  Result Date: 09/28/2018 CLINICAL DATA:  Altered level of consciousness. Recent diagnosis of stage IV breast cancer. EXAM: CT HEAD WITHOUT CONTRAST TECHNIQUE: Contiguous axial images were obtained from the base of the skull through the vertex without intravenous contrast. COMPARISON:  MRI of the brain 09/20/2018. FINDINGS: Brain: Mild atrophy  and white matter disease is present. No acute infarct or hemorrhage is present. There is no mass lesion. 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 Ventricles are proportionate to the degree of atrophy. No significant extra-axial fluid collection is present. Vascular: Atherosclerotic calcifications are present within the cavernous internal carotid arteries. There is no hyperdense vessel. Skull: Calvarium is intact. There is thinning of the scalp near the vertex. Sinuses/Orbits: The paranasal sinuses and mastoid air cells are clear. Globes and orbits are within normal limits. IMPRESSION: 1. No acute intracranial abnormality. 2. No evidence of metastatic disease the brain. 3. Stable mild atrophy and white matter disease. Electronically Signed   By: San Morelle M.D.   On: 09/28/2018 20:55    Procedures Procedures (including critical care time)  Medications Ordered in ED Medications  sodium chloride 0.9 % bolus 1,000 mL (1,000 mLs Intravenous New Bag/Given 09/28/18 2114)     Initial Impression / Assessment and Plan / ED Course  I have reviewed the triage vital signs and the nursing notes.  Pertinent labs & imaging results that were available during my care of the patient were reviewed by me and considered in my medical decision making (see chart for details).    Patient presents to the ED sent in by her allergist.  Patient recently began treatment for metastatic breast cancer with liver mets.  Was admitted last week and discharged on Friday but family reports she has been worsening since.  She had blood work done at her oncologist office yesterday and apparently got called today for results stating that her kidney function and liver enzymes were significantly changed from her previous visit.  During examination patient is alert x1 does know her name but reports she is at [facility] and points at her sister stating that is her aunt.  Roommate Leslie and start at  the bedside [name] states she has been decreasing mental status since returning back, and has not been ambulatory.  CBC showed no leukocytosis.CMP showed trend in her creatinine at 2.5, AST and ALT are also elevated consistent with patient's previous visit.  Troponin 0.02.  The level is improved from 45 yesterday to 38 today.  CT head revealed no acute abnormality, no meds noted on CT.  DG chest 2 view showed trace pleural effusions.  Has been provided  with fluids, Raquel Sarna is at the bedside.  Dr. Darl Householder has seen this patient and agrees with our plan on admitting patient for further evaluation.  Remained at the bedside reports her living situation is not safe for patient as she had to call EMS in order to have patient placed in bed.  10:08 PM Consult placed for hospitalist admission.  10:30 PM Spoke to Dr. Carlton Adam who will evaluate patient in the ED and admit.   Final Clinical Impressions(s) / ED Diagnoses   Final diagnoses:  Weakness  Dehydration  Encephalopathy    ED Discharge Orders    None       Janeece Fitting, PA-C 09/28/18 2230    Drenda Freeze, MD 09/30/18 1510

## 2018-09-28 NOTE — ED Triage Notes (Signed)
Pt was diagnosed with stage 4 breast and liver cancer recently. Pt also has hx of chronic kidney issues, which causes pt not to eat and drink. Since this has been going on, pt has been very weak. Family reports word salad, and pt does not remember hospital visit. Per family, memory has not been good, and pt has been falling. Family states pt was not getting up today at all.Pt was recently given rx for Ibrance and letrozole 3.5mg 

## 2018-09-28 NOTE — Telephone Encounter (Signed)
Oral Oncology Pharmacist Encounter  Received call from patient's roommate, [name], with complaints that patient's performance status has dramatically decreased from yesterday. Patient seen in the office with Dr. Payton Mccallum on 09/27/2018 and prescribed Ibrance plus Femara for metastatic breast cancer. [name] stated that they started the Ibrance last night and patient has had a second dose today. She describes patient's demeanor as increasingly confused, incoherent, extremely lethargic, with urine that is the color of coffee. Labs from yesterday reviewed. Ammonia is increasing and kidney function is worsening. [name] instructed to take patient to the emergency department immediately. When questioned whether [name] would be able to get patient to the emergency department, she stated that she would not be able to by herself, but she would reach out to patient's sister, [name]. [name] instructed that if she is not able to get patient to the emergency department that she needs to call an ambulance and that patient required immediate medical care. This pharmacist had to state that patient needed immediate medical care several times before the end of the conversation. MD is out of the office today. I left a voicemail for Visual merchandiser with above information.  Johny Drilling, PharmD, BCPS, BCOP  09/28/2018 3:58 PM [facility] [phone]

## 2018-09-28 NOTE — Telephone Encounter (Signed)
Patient notified and will be at appointment on 10/02/2018 at 920am.

## 2018-09-28 NOTE — Telephone Encounter (Signed)
Nurse called patient's roommate to follow up and reinforce recommendations given by pharmacist.  Cheyenne Mckenzie is having patient's sister take her to emergency department at [facility].

## 2018-09-28 NOTE — ED Notes (Addendum)
EKG given to Cook,MD.  

## 2018-09-28 NOTE — H&P (Signed)
History and Physical    Cheyenne Mckenzie IRJ:[id number] DOB: 07/11/1951 DOA: 09/28/2018  PCP: Stephens Shire, MD Patient coming from: Home  Chief Complaint: AMS  HPI: Cheyenne Mckenzie is a 67 y.o. female with medical history significant of anxiety, ADD, depression, HTN, CKD 3 presenting to the hospital for evaluation of altered mental status.  Per sister and roommate at bedside patient has been confused since her previous hospitalization.  States her appetite is okay but she she has not been consuming enough fluid due to her confusion.  She has been sleeping all the time and having generalized weakness.  Per roommate, when patient speaks it sounds like a "word salad."  She has not had any fevers, chills, nausea, vomiting, or diarrhea.  They believe her urine appears dark.  States Dr. Arnoldo Lenis office advised them to bring the patient into the hospital due to abnormal labs.  Patient has not started taking her cancer pain medications yet.  Family thinks her mental status has improved since she has received IV fluids in the ED.  Patient was recently admitted from September 19, 2018 to September 22, 2018.  CT chest revealed a right-sided breast mass which was thought to be likely breast cancer with liver mets, status post liver biopsy.  Patient was also encephalopathic and MRI brain did not show any acute findings.  Ammonia was only mildly elevated at 41.  Liver biopsy revealed metastatic carcinoma.  Patient was seen by Dr. Sonny Dandy on September 27, 2018.  ED Course: Afebrile and remainder of vitals stable on arrival.  CBC normal.  Creatinine 2.5, was 2.1 two days ago.  Corrected calcium 12.1; was 12.5 two days ago.  AST 140, ALT 48, alk phos 324, and T bili 1.0.  LFTs improved compared to labs done 2 days ago.  Ammonia mildly elevated at 38.  UA pending.  I-STAT troponin negative and EKG not suggestive of ACS.  Chest x-ray showing mild cardiomegaly and trace pleural effusions.  CT head showing no acute abnormality or  evidence of metastatic disease to the brain. [facility] paged to admit.   Review of Systems: As per HPI otherwise 10 point review of systems negative.  Past Medical History:  Diagnosis Date  . ADD (attention deficit disorder)   . Anxiety   . Depression   . Essential hypertension   . RLS (restless legs syndrome)   . Thyroid disease   . Tobacco abuse     Past Surgical History:  Procedure Laterality Date  . KNEE SURGERY Left   . WRIST SURGERY Right      reports that she has been smoking. She has never used smokeless tobacco. She reports that she drank alcohol. She reports that she does not use drugs.  Allergies  Allergen Reactions  . Erythromycin Base Nausea And Vomiting  . Sulfamethoxazole Nausea And Vomiting    Can only take with food    Family History  Problem Relation Age of Onset  . Cancer Mother        Mother died of cancer, not know which type of cancer  . Cancer Father        Father died of cancer, not know which type of cancer.    Prior to Admission medications   Medication Sig Start Date End Date Taking? Authorizing Provider  ALPRAZolam Duanne Moron) 0.5 MG tablet Take 0.5 mg by mouth 2 (two) times daily.   Yes [provider]  amphetamine-dextroamphetamine (ADDERALL) 30 MG tablet Take 30 mg by mouth 2 (  two) times daily. 09/07/18  Yes [provider]  aspirin-acetaminophen-caffeine (EXCEDRIN MIGRAINE) [phone] MG tablet Take 2 tablets by mouth every 6 (six) hours as needed for headache.   Yes [provider]  letrozole (FEMARA) 2.5 MG tablet Take 1 tablet (2.5 mg total) by mouth daily. 09/27/18  Yes Nicholas Lose, MD  levothyroxine (SYNTHROID, LEVOTHROID) 137 MCG tablet Take 137 mcg by mouth daily. 08/21/18  Yes [provider]  oxyCODONE (OXY IR/ROXICODONE) 5 MG immediate release tablet Take 1 tablet (5 mg total) by mouth every 6 (six) hours as needed for moderate pain. 09/27/18  Yes Nicholas Lose, MD  palbociclib Leslee Home) 125 MG capsule  Take 1 capsule (125 mg total) by mouth daily with breakfast. Take whole with food. Take for 21 days on, 7 days off, repeat every 28 days. 09/27/18  Yes Nicholas Lose, MD  PARoxetine (PAXIL) 40 MG tablet Take 40 mg by mouth daily. 06/26/18  Yes [provider]  fentaNYL (DURAGESIC - DOSED MCG/HR) 25 MCG/HR patch Place 1 patch (25 mcg total) onto the skin every 3 (three) days. 09/27/18   Nicholas Lose, MD  senna-docusate (SENOKOT-S) 8.6-50 MG tablet Take 1 tablet by mouth at bedtime. Patient taking differently: Take 1 tablet by mouth at bedtime as needed for mild constipation.  09/22/18   Florencia Reasons, MD    Physical Exam: Vitals:   09/28/18 2235 09/28/18 2311 09/28/18 2330 09/29/18 0001  BP: (!) 158/93 (!) 155/121 (!) 157/105 (!) 157/105  Pulse: 83 83 90 84  Resp: 19 (!) 22 16 20   Temp:      TempSrc:      SpO2: 92% 97% 93% 96%  Weight:      Height:        Physical Exam  Constitutional: No distress.  Resting comfortably in a hospital stretcher  HENT:  Head: Normocephalic.  Mouth/Throat: Oropharynx is clear and moist.  Eyes: Pupils are equal, round, and reactive to light. EOM are normal. Right eye exhibits no discharge. Left eye exhibits no discharge.  Neck: Neck supple. No tracheal deviation present.  Cardiovascular: Normal rate, regular rhythm and intact distal pulses.  Pulmonary/Chest: Effort normal and breath sounds normal. No respiratory distress. She has no wheezes. She has no rales.  Abdominal: Soft. Bowel sounds are normal. She exhibits no distension. There is no tenderness.  Musculoskeletal: She exhibits no edema.  Neurological:  Awake and alert Oriented to person only No slurring of speech Tongue midline No facial droop Strength 5 out of 5 in bilateral upper and lower extremities. Sensation to light touch intact throughout.  Skin: Skin is warm and dry. She is not diaphoretic.  Psychiatric:  Laughing     Labs on Admission: I have personally reviewed following labs  and imaging studies  CBC: Recent Labs  Lab 09/22/18 0717 09/27/18 1335 09/28/18 2022  WBC 5.3 4.5 4.6  NEUTROABS 3.5 3.2 3.1  HGB 11.1* 13.4 12.9  HCT 36.2 43.1 42.7  MCV 100.3* 98.6 99.3  PLT 152 177 208   Basic Metabolic Panel: Recent Labs  Lab 09/22/18 0717 09/27/18 1335 09/28/18 2022  NA 137 140 139  K 4.4 4.3 4.5  CL 109 105 105  CO2 19* 23 23  GLUCOSE 71 119* 88  BUN 27* 27* 32*  CREATININE 1.47* 2.11* 2.50*  CALCIUM 9.6 11.3* 11.2*  MG 1.8  --   --    GFR: Estimated Creatinine Clearance: 21.3 mL/min (A) (by C-G formula based on SCr of 2.5 mg/dL (H)). Liver  Function Tests: Recent Labs  Lab 09/22/18 0717 09/27/18 1335 09/28/18 2022  AST 121* 160* 140*  ALT 53* 54* 48*  ALKPHOS 271* 406* 324*  BILITOT 1.5* 1.1 1.0  PROT 5.7* 6.5 6.7  ALBUMIN 2.5* 2.5* 2.9*   No results for input(s): LIPASE, AMYLASE in the last 168 hours. Recent Labs  Lab 09/27/18 1334 09/28/18 2023  AMMONIA 45* 38*   Coagulation Profile: No results for input(s): INR, PROTIME in the last 168 hours. Cardiac Enzymes: No results for input(s): CKTOTAL, CKMB, CKMBINDEX, TROPONINI in the last 168 hours. BNP (last 3 results) No results for input(s): PROBNP in the last 8760 hours. HbA1C: No results for input(s): HGBA1C in the last 72 hours. CBG: No results for input(s): GLUCAP in the last 168 hours. Lipid Profile: No results for input(s): CHOL, HDL, LDLCALC, TRIG, CHOLHDL, LDLDIRECT in the last 72 hours. Thyroid Function Tests: No results for input(s): TSH, T4TOTAL, FREET4, T3FREE, THYROIDAB in the last 72 hours. Anemia Panel: No results for input(s): VITAMINB12, FOLATE, FERRITIN, TIBC, IRON, RETICCTPCT in the last 72 hours. Urine analysis:    Component Value Date/Time   COLORURINE AMBER (A) 09/27/2018 1314   APPEARANCEUR CLEAR 09/27/2018 1314   LABSPEC 1.015 09/27/2018 1314   PHURINE 5.0 09/27/2018 1314   GLUCOSEU NEGATIVE 09/27/2018 1314   HGBUR NEGATIVE 09/27/2018 1314    BILIRUBINUR NEGATIVE 09/27/2018 1314   KETONESUR NEGATIVE 09/27/2018 1314   PROTEINUR NEGATIVE 09/27/2018 1314   NITRITE NEGATIVE 09/27/2018 1314   LEUKOCYTESUR SMALL (A) 09/27/2018 1314    Radiological Exams on Admission: Dg Chest 2 View  Result Date: 09/28/2018 CLINICAL DATA:  Altered mental status EXAM: CHEST - 2 VIEW COMPARISON:  09/20/2018 FINDINGS: Trace pleural effusions on the lateral view. Mild cardiomegaly. No acute consolidation. No pneumothorax. IMPRESSION: Cardiomegaly.  Trace pleural effusions. Electronically Signed   By: Donavan Foil M.D.   On: 09/28/2018 20:53   Ct Head Wo Contrast  Result Date: 09/28/2018 CLINICAL DATA:  Altered level of consciousness. Recent diagnosis of stage IV breast cancer. EXAM: CT HEAD WITHOUT CONTRAST TECHNIQUE: Contiguous axial images were obtained from the base of the skull through the vertex without intravenous contrast. COMPARISON:  MRI of the brain 09/20/2018. FINDINGS: Brain: Mild atrophy and white matter disease is present. No acute infarct or hemorrhage is present. There is no mass lesion. 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 Ventricles are proportionate to the degree of atrophy. No significant extra-axial fluid collection is present. Vascular: Atherosclerotic calcifications are present within the cavernous internal carotid arteries. There is no hyperdense vessel. Skull: Calvarium is intact. There is thinning of the scalp near the vertex. Sinuses/Orbits: The paranasal sinuses and mastoid air cells are clear. Globes and orbits are within normal limits. IMPRESSION: 1. No acute intracranial abnormality. 2. No evidence of metastatic disease the brain. 3. Stable mild atrophy and white matter disease. Electronically Signed   By: San Morelle M.D.   On: 09/28/2018 20:55    EKG: Independently reviewed.  Sinus rhythm and Q waves in inferolateral leads.  No prior EKG for comparison.  Assessment/Plan Principal  Problem:   Acute metabolic encephalopathy Active Problems:   ADD (attention deficit disorder)   Depression with anxiety   Hypothyroidism   Acute renal failure superimposed on stage 3 chronic kidney disease (HCC)   Hypercalcemia   Essential hypertension   Elevated liver function tests   Metastatic breast cancer (HCC)  Acute metabolic encephalopathy Possibly related to dehydration and abnormal liver  function.  LFTs elevated in the setting of breast cancer metastasis to liver but overall improved since labs checked 2 days ago.  Ammonia mildly elevated at 38. CT head showing no acute abnormality or evidence of metastatic disease to the brain.  She was encephalopathic during her recent hospitalization and MRI done at that time did not show any acute findings.  No signs of infection -afebrile and no leukocytosis. -Will give a dose of lactulose  -Continue IV fluid -Patient is currently on Synthroid 137 mcg daily.  TSH checked on October 30 was mildly elevated at 4.85; no dose change. Will increase dose of Synthroid to 150 mcg daily.  Repeat TSH in 3 months. -Check B12 level -UA pending  Metastatic breast cancer with liver mets Diagnosed during her recent hospitalization.  Seen by Dr. Payton Mccallum September 27, 2018 and started on Ibrance with letrozole. -Continue Ibrance and letrozole -Consult oncology in the morning -Patient is currently comfortable and not complaining of pain.  Hold sedating pain medications at this time in the setting of encephalopathy.  AKI on CKD 3 Creatinine 2.5, was 2.1 two days ago.  Likely due to dehydration. -IV fluid resuscitation -Avoid nephrotoxic agents/contrast -Repeat BMP in a.m.  Abnormal liver function In the setting of cancer metastasis to liver.  AST 140, ALT 48, alk phos 324, and T bili 1.0.  LFTs improved compared to labs done 2 days ago.  Ammonia mildly elevated at 38.  Patient is not complaining of abdominal pain.  Abdominal exam benign. -Will give a dose  of lactulose for encephalopathy -Repeat hepatic function panel in the morning  Hypercalcemia of malignancy Corrected calcium 12.1; was 12.5 two days ago.  Could possibly be contributing to her encephalopathy. -IV fluid resuscitation -Check ionized calcium level  Anxiety, depression -Continue home Paxil -Hold home benzodiazepine at this time  ADD -Continue home Adderall  Hypothyroidism Patient is currently on Synthroid 137 mcg daily.  TSH checked on October 30 was mildly elevated at 4.85; no dose change.  -Will increase dose of Synthroid to 150 mcg daily.   -Repeat TSH in 3 months.  HTN -Currently not on any home medications. -IV hydralazine 5 mg every 4 hours as needed for SBP >160  DVT prophylaxis: Lovenox 30 mg daily Code Status: Patient is currently disoriented.  Sister at bedside wants her to be full code.  This needs to be discussed further with the patient when her mental status improves. Family Communication: Sister and roommate at bedside updated. Disposition Plan: Anticipate discharge to home in 1 to 2 days. Consults called: None  Admission status: Observation   Shela Leff MD [facility] Pager [phone]  If 7PM-7AM, please contact night-coverage [email or web address] Password [email or web address]  09/29/2018, 1:09 AM

## 2018-09-28 NOTE — ED Notes (Signed)
Patient transported to CT 

## 2018-09-29 ENCOUNTER — Other Ambulatory Visit: Payer: Self-pay

## 2018-09-29 ENCOUNTER — Observation Stay (HOSPITAL_COMMUNITY): Payer: Medicare Other

## 2018-09-29 DIAGNOSIS — R1310 Dysphagia, unspecified: Secondary | ICD-10-CM | POA: Diagnosis present

## 2018-09-29 DIAGNOSIS — G9341 Metabolic encephalopathy: Secondary | ICD-10-CM | POA: Diagnosis not present

## 2018-09-29 DIAGNOSIS — F0390 Unspecified dementia without behavioral disturbance: Secondary | ICD-10-CM | POA: Diagnosis present

## 2018-09-29 DIAGNOSIS — F17200 Nicotine dependence, unspecified, uncomplicated: Secondary | ICD-10-CM | POA: Diagnosis present

## 2018-09-29 DIAGNOSIS — C799 Secondary malignant neoplasm of unspecified site: Secondary | ICD-10-CM | POA: Diagnosis not present

## 2018-09-29 DIAGNOSIS — Z515 Encounter for palliative care: Secondary | ICD-10-CM | POA: Diagnosis present

## 2018-09-29 DIAGNOSIS — Z66 Do not resuscitate: Secondary | ICD-10-CM | POA: Diagnosis not present

## 2018-09-29 DIAGNOSIS — L89619 Pressure ulcer of right heel, unspecified stage: Secondary | ICD-10-CM | POA: Diagnosis present

## 2018-09-29 DIAGNOSIS — K7290 Hepatic failure, unspecified without coma: Secondary | ICD-10-CM | POA: Diagnosis present

## 2018-09-29 DIAGNOSIS — R627 Adult failure to thrive: Secondary | ICD-10-CM | POA: Diagnosis present

## 2018-09-29 DIAGNOSIS — F988 Other specified behavioral and emotional disorders with onset usually occurring in childhood and adolescence: Secondary | ICD-10-CM | POA: Diagnosis present

## 2018-09-29 DIAGNOSIS — S21001A Unspecified open wound of right breast, initial encounter: Secondary | ICD-10-CM | POA: Diagnosis present

## 2018-09-29 DIAGNOSIS — I129 Hypertensive chronic kidney disease with stage 1 through stage 4 chronic kidney disease, or unspecified chronic kidney disease: Secondary | ICD-10-CM | POA: Diagnosis present

## 2018-09-29 DIAGNOSIS — R32 Unspecified urinary incontinence: Secondary | ICD-10-CM | POA: Diagnosis present

## 2018-09-29 DIAGNOSIS — G92 Toxic encephalopathy: Secondary | ICD-10-CM | POA: Diagnosis present

## 2018-09-29 DIAGNOSIS — N183 Chronic kidney disease, stage 3 (moderate): Secondary | ICD-10-CM | POA: Diagnosis present

## 2018-09-29 DIAGNOSIS — G2581 Restless legs syndrome: Secondary | ICD-10-CM | POA: Diagnosis present

## 2018-09-29 DIAGNOSIS — S40022A Contusion of left upper arm, initial encounter: Secondary | ICD-10-CM | POA: Diagnosis present

## 2018-09-29 DIAGNOSIS — R609 Edema, unspecified: Secondary | ICD-10-CM | POA: Diagnosis not present

## 2018-09-29 DIAGNOSIS — Y92238 Other place in hospital as the place of occurrence of the external cause: Secondary | ICD-10-CM | POA: Diagnosis present

## 2018-09-29 DIAGNOSIS — F418 Other specified anxiety disorders: Secondary | ICD-10-CM | POA: Diagnosis present

## 2018-09-29 DIAGNOSIS — W06XXXA Fall from bed, initial encounter: Secondary | ICD-10-CM | POA: Diagnosis present

## 2018-09-29 DIAGNOSIS — X58XXXA Exposure to other specified factors, initial encounter: Secondary | ICD-10-CM | POA: Diagnosis present

## 2018-09-29 DIAGNOSIS — C787 Secondary malignant neoplasm of liver and intrahepatic bile duct: Secondary | ICD-10-CM | POA: Diagnosis present

## 2018-09-29 DIAGNOSIS — N179 Acute kidney failure, unspecified: Secondary | ICD-10-CM | POA: Diagnosis present

## 2018-09-29 DIAGNOSIS — E860 Dehydration: Secondary | ICD-10-CM | POA: Diagnosis present

## 2018-09-29 DIAGNOSIS — E039 Hypothyroidism, unspecified: Secondary | ICD-10-CM | POA: Diagnosis present

## 2018-09-29 DIAGNOSIS — R7989 Other specified abnormal findings of blood chemistry: Secondary | ICD-10-CM | POA: Diagnosis present

## 2018-09-29 DIAGNOSIS — C50919 Malignant neoplasm of unspecified site of unspecified female breast: Secondary | ICD-10-CM | POA: Diagnosis not present

## 2018-09-29 DIAGNOSIS — C50911 Malignant neoplasm of unspecified site of right female breast: Secondary | ICD-10-CM | POA: Diagnosis present

## 2018-09-29 LAB — VITAMIN B12: Vitamin B-12: >7500 pg/mL — ABNORMAL HIGH (ref 180–914)

## 2018-09-29 LAB — HEPATIC FUNCTION PANEL
ALT: 47 U/L — ABNORMAL HIGH (ref 0–44)
AST: 134 U/L — AB (ref 15–41)
Albumin: 2.5 g/dL — ABNORMAL LOW (ref 3.5–5.0)
Alkaline Phosphatase: 340 U/L — ABNORMAL HIGH (ref 38–126)
BILIRUBIN DIRECT: 0.5 mg/dL — AB (ref 0.0–0.2)
Indirect Bilirubin: 0.3 mg/dL (ref 0.3–0.9)
TOTAL PROTEIN: 5.7 g/dL — AB (ref 6.5–8.1)
Total Bilirubin: 0.8 mg/dL (ref 0.3–1.2)

## 2018-09-29 LAB — URINALYSIS, ROUTINE W REFLEX MICROSCOPIC
Bilirubin Urine: NEGATIVE
Glucose, UA: NEGATIVE mg/dL
Hgb urine dipstick: NEGATIVE
Ketones, ur: NEGATIVE mg/dL
Nitrite: NEGATIVE
PROTEIN: NEGATIVE mg/dL
Specific Gravity, Urine: 1.012 (ref 1.005–1.030)
pH: 5 (ref 5.0–8.0)

## 2018-09-29 LAB — BASIC METABOLIC PANEL
ANION GAP: 10 (ref 5–15)
BUN: 31 mg/dL — ABNORMAL HIGH (ref 8–23)
CALCIUM: 10.8 mg/dL — AB (ref 8.9–10.3)
CO2: 22 mmol/L (ref 22–32)
Chloride: 105 mmol/L (ref 98–111)
Creatinine, Ser: 2.31 mg/dL — ABNORMAL HIGH (ref 0.44–1.00)
GFR calc non Af Amer: 20 mL/min — ABNORMAL LOW (ref >60)
GFR, EST AFRICAN AMERICAN: 24 mL/min — AB (ref >60)
Glucose, Bld: 97 mg/dL (ref 70–99)
Potassium: 4.1 mmol/L (ref 3.5–5.1)
Sodium: 137 mmol/L (ref 135–145)

## 2018-09-29 LAB — CBC
HCT: 41.3 % (ref 36.0–46.0)
Hemoglobin: 12.6 g/dL (ref 12.0–15.0)
MCH: 31 pg (ref 26.0–34.0)
MCHC: 30.5 g/dL (ref 30.0–36.0)
MCV: 101.5 fL — ABNORMAL HIGH (ref 80.0–100.0)
Platelets: 166 10*3/uL (ref 150–400)
RBC: 4.07 MIL/uL (ref 3.87–5.11)
RDW: 14.6 % (ref 11.5–15.5)
WBC: 5.7 10*3/uL (ref 4.0–10.5)
nRBC: 0 % (ref 0.0–0.2)

## 2018-09-29 MED ORDER — LACTULOSE 10 GM/15ML PO SOLN: 10.0000 g | Freq: Two times a day (BID) | ORAL | Status: DC

## 2018-09-29 MED ORDER — LACTULOSE 10 GM/15ML PO SOLN: 10.0000 g | Freq: Once | ORAL | Status: DC

## 2018-09-29 MED ORDER — ENOXAPARIN SODIUM 30 MG/0.3ML ~~LOC~~ SOLN: 30.0000 mg | SUBCUTANEOUS | Status: DC

## 2018-09-29 MED ORDER — SODIUM CHLORIDE 0.9 % IV SOLN: INTRAVENOUS | Status: DC

## 2018-09-29 MED ORDER — ENOXAPARIN SODIUM 30 MG/0.3ML ~~LOC~~ SOLN
30.0000 mg | SUBCUTANEOUS | Status: DC
  Administered 2018-09-30 – 2018-10-02 (×3): 30 mg via SUBCUTANEOUS
  Filled 2018-09-29 (×5): qty 0.3

## 2018-09-29 MED ORDER — AMPHETAMINE-DEXTROAMPHETAMINE 10 MG PO TABS
30.0000 mg | ORAL_TABLET | Freq: Two times a day (BID) | ORAL | Status: DC
  Administered 2018-09-29 – 2018-10-05 (×12): 30 mg via ORAL
  Filled 2018-09-29 (×12): qty 3

## 2018-09-29 MED ORDER — HYDRALAZINE HCL 20 MG/ML IJ SOLN
5.0000 mg | INTRAMUSCULAR | Status: DC | PRN
  Administered 2018-09-29 – 2018-10-03 (×6): 5 mg via INTRAVENOUS
  Filled 2018-09-29 (×6): qty 1

## 2018-09-29 MED ORDER — ENSURE ENLIVE PO LIQD
237.0000 mL | Freq: Two times a day (BID) | ORAL | Status: DC
  Administered 2018-10-01 – 2018-10-02 (×3): 237 mL via ORAL

## 2018-09-29 MED ORDER — PAROXETINE HCL 20 MG PO TABS
40.0000 mg | ORAL_TABLET | Freq: Every day | ORAL | Status: DC
  Administered 2018-09-29 – 2018-10-05 (×7): 40 mg via ORAL
  Filled 2018-09-29 (×7): qty 2

## 2018-09-29 MED ORDER — PALBOCICLIB 125 MG PO CAPS
125.0000 mg | ORAL_CAPSULE | Freq: Every day | ORAL | Status: DC
  Administered 2018-09-30 – 2018-10-01 (×2): 125 mg via ORAL

## 2018-09-29 MED ORDER — LEVOTHYROXINE SODIUM 50 MCG PO TABS
150.0000 ug | ORAL_TABLET | Freq: Every day | ORAL | Status: DC
  Administered 2018-09-29 – 2018-10-05 (×6): 150 ug via ORAL
  Filled 2018-09-29 (×7): qty 1

## 2018-09-29 MED ORDER — LEVOTHYROXINE SODIUM 137 MCG PO TABS: 137.0000 ug | ORAL_TABLET | Freq: Every day | ORAL | Status: DC

## 2018-09-29 MED ORDER — SENNOSIDES-DOCUSATE SODIUM 8.6-50 MG PO TABS: 1.0000 | ORAL_TABLET | Freq: Every evening | ORAL | Status: DC | PRN

## 2018-09-29 MED ORDER — SODIUM CHLORIDE 0.9 % IV SOLN
INTRAVENOUS | Status: DC
  Administered 2018-09-29 – 2018-10-04 (×8): via INTRAVENOUS

## 2018-09-29 MED ORDER — LETROZOLE 2.5 MG PO TABS
2.5000 mg | ORAL_TABLET | Freq: Every day | ORAL | Status: DC
  Administered 2018-09-30 – 2018-10-03 (×4): 2.5 mg via ORAL
  Filled 2018-09-29 (×6): qty 1

## 2018-09-29 MED ORDER — LACTULOSE 10 GM/15ML PO SOLN
20.0000 g | Freq: Two times a day (BID) | ORAL | Status: DC
  Administered 2018-09-29 – 2018-10-04 (×10): 20 g via ORAL
  Filled 2018-09-29 (×13): qty 30

## 2018-09-29 NOTE — Progress Notes (Signed)
PROGRESS NOTE    MARIJA CALAMARI  EHU:[id number] DOB: 12-22-1950 DOA: 09/28/2018 PCP: Stephens Shire, MD    Brief Narrative: Cheyenne Mckenzie is a 67 y.o. female with medical history significant of anxiety, ADD, depression, HTN, CKD 3 presenting to the hospital for evaluation of altered mental status.  Per sister and roommate at bedside patient has been confused since her previous hospitalization.  States her appetite is okay but she she has not been consuming enough fluid due to her confusion.  She has been sleeping all the time and having generalized weakness.  Per roommate, when patient speaks it sounds like a "word salad."  She has not had any fevers, chills, nausea, vomiting, or diarrhea.  They believe her urine appears dark.  States Dr. Arnoldo Lenis office advised them to bring the patient into the hospital due to abnormal labs.  Patient has not started taking her cancer pain medications yet.  Family thinks her mental status has improved since she has received IV fluids in the ED.  Patient was recently admitted from September 19, 2018 to September 22, 2018.  CT chest revealed a right-sided breast mass which was thought to be likely breast cancer with liver mets, status post liver biopsy.  Patient was also encephalopathic and MRI brain did not show any acute findings.  Ammonia was only mildly elevated at 41.  Liver biopsy revealed metastatic carcinoma.  Patient was seen by Dr. Sonny Dandy on September 27, 2018.  ED Course: Afebrile and remainder of vitals stable on arrival.  CBC normal.  Creatinine 2.5, was 2.1 two days ago.  Corrected calcium 12.1; was 12.5 two days ago.  AST 140, ALT 48, alk phos 324, and T bili 1.0.  LFTs improved compared to labs done 2 days ago.  Ammonia mildly elevated at 38.  UA pending.  I-STAT troponin negative and EKG not suggestive of ACS.  Chest x-ray showing mild cardiomegaly and trace pleural effusions.  CT head showing no acute abnormality or evidence of metastatic disease to the  brain. TR[facility] paged to admit.     Assessment & Plan:   Principal Problem:   Acute metabolic encephalopathy Active Problems:   ADD (attention deficit disorder)   Depression with anxiety   Hypothyroidism   Acute renal failure superimposed on stage 3 chronic kidney disease (HCC)   Hypercalcemia   Essential hypertension   Elevated liver function tests   Metastatic breast cancer ([place])   1-Acute on chronic renal failure stage III;  Prior cr 1.4.  Presents with cr at 2.1.  Could be related to hypovolemia.  Continue with IV fluid.  Check renal US.  Strict I and O. Bladder scan.   2-Acute metabolic encephalopathy; component of hepatic encephalopathy;  Related to dehydration, vs component of hepatic encephalopathy  Ammonia mildly elevated.  Continue with IV fluids.  Lactulose BID>  CT head negative.   Hypercalcemia; malignancy  continue with IV fluid.  Calcium trending down,   Transaminases; Secondary to metastasis diseases to liver.   Right side metastatic  Breast cancer, metastasis to liver,  Follow with Dr Lindi Adie, added to rounding list.  Started on Ibrace abd letrozole.   Anxiety, depression -Continue  Paxil -Hold home benzodiazepine at this time  ADD -Continue home Adderall  Hypothyroidism Patient is currently on Synthroid 137 mcg daily.  TSH checked on October 30 was mildly elevated at 4.85; no dose change.  -Dose Synthroid to 150 mcg daily.   -Repeat TSH in 3 months.  HTN -Currently not  on any home medications. -IV hydralazine 5 mg every 4 hours as needed for SBP >160  RN Pressure Injury Documentation:    Malnutrition Type:      Malnutrition Characteristics:      Nutrition Interventions:     Estimated body mass index is 21.24 kg/m as calculated from the following:   Height as of this encounter: 5' 8"  (1.727 m).   Weight as of this encounter: 63.4 kg.   DVT prophylaxis: lovenox Code Status: full code.  Family Communication: no family  at bedside.  Disposition Plan: remain in the hospital for IV fluids. Monitor renal function. PT evaluation    Consultants:   Dr Lindi Adie , added to rounding team.    Procedures:   Renal US.    Antimicrobials:   None    Subjective: Alert, not eating well. No BM.    Objective: Vitals:   09/29/18 0630 09/29/18 0700 09/29/18 0730 09/29/18 0824  BP: (!) 148/103 (!) 172/108 (!) 153/106 (!) 161/100  Pulse: 100 (!) 101  99  Resp: (!) 21 (!) 24 20 (!) 24  Temp:    98 F (36.7 C)  TempSrc:    Oral  SpO2: 97% 98% (!) 85% 97%  Weight:      Height:        Intake/Output Summary (Last 24 hours) at 09/29/2018 0840 Last data filed at 09/28/2018 2307 Gross per 24 hour  Intake 1000 ml  Output -  Net 1000 ml   Filed Weights   09/28/18 1910  Weight: 63.4 kg    Examination:  General exam: Appears calm and comfortable  Respiratory system: Clear to auscultation. Respiratory effort normal. Cardiovascular system: S1 & S2 heard, RRR. No JVD, murmurs, rubs, gallops or clicks. No pedal edema. Gastrointestinal system: Abdomen is nondistended, soft and nontender. No organomegaly or masses felt. Normal bowel sounds heard. Central nervous system: Alert confuse  Extremities: Symmetric 5 x 5 power. Skin: No rashes, lesions or ulcers    Data Reviewed: I have personally reviewed following labs and imaging studies  CBC: Recent Labs  Lab 09/27/18 1335 09/28/18 2022  WBC 4.5 4.6  NEUTROABS 3.2 3.1  HGB 13.4 12.9  HCT 43.1 42.7  MCV 98.6 99.3  PLT 177 476   Basic Metabolic Panel: Recent Labs  Lab 09/27/18 1335 09/28/18 2022  NA 140 139  K 4.3 4.5  CL 105 105  CO2 23 23  GLUCOSE 119* 88  BUN 27* 32*  CREATININE 2.11* 2.50*  CALCIUM 11.3* 11.2*   GFR: Estimated Creatinine Clearance: 21.3 mL/min (A) (by C-G formula based on SCr of 2.5 mg/dL (H)). Liver Function Tests: Recent Labs  Lab 09/27/18 1335 09/28/18 2022 09/29/18 0620  AST 160* 140* 134*  ALT 54* 48* 47*    ALKPHOS 406* 324* 340*  BILITOT 1.1 1.0 0.8  PROT 6.5 6.7 5.7*  ALBUMIN 2.5* 2.9* 2.5*   No results for input(s): LIPASE, AMYLASE in the last 168 hours. Recent Labs  Lab 09/27/18 1334 09/28/18 2023  AMMONIA 45* 38*   Coagulation Profile: No results for input(s): INR, PROTIME in the last 168 hours. Cardiac Enzymes: No results for input(s): CKTOTAL, CKMB, CKMBINDEX, TROPONINI in the last 168 hours. BNP (last 3 results) No results for input(s): PROBNP in the last 8760 hours. HbA1C: No results for input(s): HGBA1C in the last 72 hours. CBG: No results for input(s): GLUCAP in the last 168 hours. Lipid Profile: No results for input(s): CHOL, HDL, LDLCALC, TRIG, CHOLHDL, LDLDIRECT in the last  72 hours. Thyroid Function Tests: No results for input(s): TSH, T4TOTAL, FREET4, T3FREE, THYROIDAB in the last 72 hours. Anemia Panel: No results for input(s): VITAMINB12, FOLATE, FERRITIN, TIBC, IRON, RETICCTPCT in the last 72 hours. Sepsis Labs: No results for input(s): PROCALCITON, LATICACIDVEN in the last 168 hours.  Recent Results (from the past 240 hour(s))  Urine Culture     Status: None   Collection Time: 09/27/18  1:14 PM  Result Value Ref Range Status   Specimen Description   Final    URINE, CLEAN CATCH Performed at [facility], [address], [place], [place] [address]    Special Requests   Final    NONE Performed at [facility], [place] [address] [place], [place] [address]    Culture   Final    NO GROWTH Performed at [facility], [place] [address] [place], [place] [address]    Report Status 09/28/2018 FINAL  Final         Radiology Studies: Dg Chest 2 View  Result Date: 09/28/2018 CLINICAL DATA:  Altered mental status EXAM: CHEST - 2 VIEW COMPARISON:  09/20/2018 FINDINGS: Trace pleural effusions on the lateral view. Mild cardiomegaly. No acute consolidation. No pneumothorax. IMPRESSION: Cardiomegaly.  Trace  pleural effusions. Electronically Signed   By: Donavan Foil M.D.   On: 09/28/2018 20:53   Ct Head Wo Contrast  Result Date: 09/28/2018 CLINICAL DATA:  Altered level of consciousness. Recent diagnosis of stage IV breast cancer. EXAM: CT HEAD WITHOUT CONTRAST TECHNIQUE: Contiguous axial images were obtained from the base of the skull through the vertex without intravenous contrast. COMPARISON:  MRI of the brain 09/20/2018. FINDINGS: Brain: Mild atrophy and white matter disease is present. No acute infarct or hemorrhage is present. There is no mass lesion. 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 Ventricles are proportionate to the degree of atrophy. No significant extra-axial fluid collection is present. Vascular: Atherosclerotic calcifications are present within the cavernous internal carotid arteries. There is no hyperdense vessel. Skull: Calvarium is intact. There is thinning of the scalp near the vertex. Sinuses/Orbits: The paranasal sinuses and mastoid air cells are clear. Globes and orbits are within normal limits. IMPRESSION: 1. No acute intracranial abnormality. 2. No evidence of metastatic disease the brain. 3. Stable mild atrophy and white matter disease. Electronically Signed   By: San Morelle M.D.   On: 09/28/2018 20:55        Scheduled Meds: . amphetamine-dextroamphetamine  30 mg Oral BID  . enoxaparin (LOVENOX) injection  30 mg Subcutaneous Q24H  . lactulose  10 g Oral Once  . letrozole  2.5 mg Oral Daily  . levothyroxine  150 mcg Oral Q0600  . palbociclib  125 mg Oral Q breakfast  . PARoxetine  40 mg Oral Daily   Continuous Infusions: . sodium chloride Stopped (09/29/18 0232)     LOS: 0 days    Time spent: 35 minutes.     Elmarie Shiley, MD [facility] Pager [phone]  If 7PM-7AM, please contact night-coverage [email or web address] Password [email or web address] 09/29/2018, 8:40 AM

## 2018-09-29 NOTE — ED Notes (Signed)
ED TO INPATIENT HANDOFF REPORT  Name/Age/Gender Cheyenne Mckenzie 67 y.o. female  Code Status    Code Status Orders  (From admission, onward)         Start     Ordered   09/29/18 0042  Full code  Continuous     09/29/18 0042        Code Status History    Date Active Date Inactive Code Status Order ID Comments User Context   09/20/2018 0449 09/22/2018 1419 Full Code [id number]  Ivor Costa, MD ED      Home/SNF/Other Home  Chief Complaint blood work issue  Level of Care/Admitting Diagnosis ED Disposition    ED Disposition Condition [place]: [facility] [[id number]]  Level of Care: Med-Surg [16]  Diagnosis: Acute metabolic encephalopathy [9741638]  Admitting Physician: Shela Leff [[id number]]  Attending Physician: Shela Leff [[id number]]  PT Class (Do Not Modify): Observation [104]  PT Acc Code (Do Not Modify): Observation [10022]       Medical History Past Medical History:  Diagnosis Date  . ADD (attention deficit disorder)   . Anxiety   . Depression   . Essential hypertension   . RLS (restless legs syndrome)   . Thyroid disease   . Tobacco abuse     Allergies Allergies  Allergen Reactions  . Erythromycin Base Nausea And Vomiting  . Sulfamethoxazole Nausea And Vomiting    Can only take with food    IV Location/Drains/Wounds Patient Lines/Drains/Airways Status   Active Line/Drains/Airways    Name:   Placement date:   Placement time:   Site:   Days:   Peripheral IV 09/29/18 Left Wrist   09/29/18    0434    Wrist   less than 1   External Urinary Catheter   09/20/18    0837    -   9          Labs/Imaging Results for orders placed or performed during the hospital encounter of 09/28/18 (from the past 48 hour(s))  CBC with Differential/Platelet     Status: None   Collection Time: 09/28/18  8:22 PM  Result Value Ref Range   WBC 4.6 4.0 - 10.5 K/uL   RBC 4.30 3.87 - 5.11 MIL/uL   Hemoglobin 12.9 12.0 -  15.0 g/dL   HCT 42.7 36.0 - 46.0 %   MCV 99.3 80.0 - 100.0 fL   MCH 30.0 26.0 - 34.0 pg   MCHC 30.2 30.0 - 36.0 g/dL   RDW 14.7 11.5 - 15.5 %   Platelets 199 150 - 400 K/uL   nRBC 0.0 0.0 - 0.2 %   Neutrophils Relative % 67 %   Neutro Abs 3.1 1.7 - 7.7 K/uL   Lymphocytes Relative 18 %   Lymphs Abs 0.9 0.7 - 4.0 K/uL   Monocytes Relative 11 %   Monocytes Absolute 0.5 0.1 - 1.0 K/uL   Eosinophils Relative 3 %   Eosinophils Absolute 0.1 0.0 - 0.5 K/uL   Basophils Relative 1 %   Basophils Absolute 0.1 0.0 - 0.1 K/uL   Immature Granulocytes 0 %   Abs Immature Granulocytes 0.01 0.00 - 0.07 K/uL    Comment: Performed at [facility] [place] [address], [place], [place] [address]  Comprehensive metabolic panel     Status: Abnormal   Collection Time: 09/28/18  8:22 PM  Result Value Ref Range   Sodium 139 135 - 145 mmol/L   Potassium 4.5 3.5 -  5.1 mmol/L   Chloride 105 98 - 111 mmol/L   CO2 23 22 - 32 mmol/L   Glucose, Bld 88 70 - 99 mg/dL   BUN 32 (H) 8 - 23 mg/dL   Creatinine, Ser 2.50 (H) 0.44 - 1.00 mg/dL   Calcium 11.2 (H) 8.9 - 10.3 mg/dL   Total Protein 6.7 6.5 - 8.1 g/dL   Albumin 2.9 (L) 3.5 - 5.0 g/dL   AST 140 (H) 15 - 41 U/L   ALT 48 (H) 0 - 44 U/L   Alkaline Phosphatase 324 (H) 38 - 126 U/L   Total Bilirubin 1.0 0.3 - 1.2 mg/dL   GFR calc non Af Amer 19 (L) >60 mL/min   GFR calc Af Amer 21 (L) >60 mL/min    Comment: (NOTE) The eGFR has been calculated using the CKD EPI equation. This calculation has not been validated in all clinical situations. eGFR's persistently <60 mL/min signify possible Chronic Kidney Disease.    Anion gap 11 5 - 15    Comment: Performed at [facility], [place] [address], [place], [place] [address]  Ammonia     Status: Abnormal   Collection Time: 09/28/18  8:23 PM  Result Value Ref Range   Ammonia 38 (H) 9 - 35 umol/L    Comment: Performed at [facility], [place] [address],  [place], [place] [address]  I-stat troponin, ED     Status: None   Collection Time: 09/28/18  9:11 PM  Result Value Ref Range   Troponin i, poc 0.02 0.00 - 0.08 ng/mL   Comment 3            Comment: Due to the release kinetics of cTnI, a negative result within the first hours of the onset of symptoms does not rule out myocardial infarction with certainty. If myocardial infarction is still suspected, repeat the test at appropriate intervals.   Urinalysis, Routine w reflex microscopic     Status: Abnormal   Collection Time: 09/29/18  6:03 AM  Result Value Ref Range   Color, Urine YELLOW YELLOW   APPearance CLEAR CLEAR   Specific Gravity, Urine 1.012 1.005 - 1.030   pH 5.0 5.0 - 8.0   Glucose, UA NEGATIVE NEGATIVE mg/dL   Hgb urine dipstick NEGATIVE NEGATIVE   Bilirubin Urine NEGATIVE NEGATIVE   Ketones, ur NEGATIVE NEGATIVE mg/dL   Protein, ur NEGATIVE NEGATIVE mg/dL   Nitrite NEGATIVE NEGATIVE   Leukocytes, UA SMALL (A) NEGATIVE   RBC / HPF 0-5 0 - 5 RBC/hpf   WBC, UA 6-10 0 - 5 WBC/hpf   Bacteria, UA RARE (A) NONE SEEN   Mucus PRESENT    Hyaline Casts, UA PRESENT     Comment: Performed at [facility], [place] [address], [place],  [address]   Dg Chest 2 View  Result Date: 09/28/2018 CLINICAL DATA:  Altered mental status EXAM: CHEST - 2 VIEW COMPARISON:  09/20/2018 FINDINGS: Trace pleural effusions on the lateral view. Mild cardiomegaly. No acute consolidation. No pneumothorax. IMPRESSION: Cardiomegaly.  Trace pleural effusions. Electronically Signed   By: Donavan Foil M.D.   On: 09/28/2018 20:53   Ct Head Wo Contrast  Result Date: 09/28/2018 CLINICAL DATA:  Altered level of consciousness. Recent diagnosis of stage IV breast cancer. EXAM: CT HEAD WITHOUT CONTRAST TECHNIQUE: Contiguous axial images were obtained from the base of the skull through the vertex without intravenous contrast. COMPARISON:  MRI of the brain 09/20/2018. FINDINGS: Brain: Mild atrophy and  white matter disease is present. No acute infarct or hemorrhage is present. There is no mass lesion. 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 Ventricles are proportionate to the degree of atrophy. No significant extra-axial fluid collection is present. Vascular: Atherosclerotic calcifications are present within the cavernous internal carotid arteries. There is no hyperdense vessel. Skull: Calvarium is intact. There is thinning of the scalp near the vertex. Sinuses/Orbits: The paranasal sinuses and mastoid air cells are clear. Globes and orbits are within normal limits. IMPRESSION: 1. No acute intracranial abnormality. 2. No evidence of metastatic disease the brain. 3. Stable mild atrophy and white matter disease. Electronically Signed   By: San Morelle M.D.   On: 09/28/2018 20:55    Pending Labs Unresulted Labs (From admission, onward)    Start     Ordered   09/29/18 0500  Hepatic function panel  Tomorrow morning,   R     09/29/18 0042   09/29/18 0500  Vitamin B12  Tomorrow morning,   R     09/29/18 0042   09/29/18 0500  Calcium, ionized  Tomorrow morning,   R     09/29/18 0059          Vitals/Pain Today's Vitals   09/29/18 0438 09/29/18 0501 09/29/18 0600 09/29/18 0630  BP: (!) 156/88 (!) 144/94 (!) 156/112 (!) 148/103  Pulse: 95 99 (!) 104 100  Resp: (!) 22 (!) 23 (!) 25 (!) 21  Temp:      TempSrc:      SpO2: 97% 97% 97% 97%  Weight:      Height:      PainSc:        Isolation Precautions No active isolations  Medications Medications  0.9 %  sodium chloride infusion ( Intravenous Hold 09/29/18 0232)  letrozole [facility]) tablet 2.5 mg (has no administration in time range)  palbociclib (IBRANCE) capsule 125 mg (has no administration in time range)  amphetamine-dextroamphetamine (ADDERALL) tablet 30 mg (0 tablets Oral Hold 09/29/18 0233)  PARoxetine (PAXIL) tablet 40 mg (has no administration in time range)  senna-docusate (Senokot-S)  tablet 1 tablet (has no administration in time range)  lactulose (CHRONULAC) 10 GM/15ML solution 10 g (0 g Oral Hold 09/29/18 0233)  levothyroxine (SYNTHROID, LEVOTHROID) tablet 150 mcg (150 mcg Oral Given 09/29/18 0625)  hydrALAZINE (APRESOLINE) injection 5 mg (5 mg Intravenous Given 09/29/18 0411)  enoxaparin (LOVENOX) injection 30 mg (has no administration in time range)  sodium chloride 0.9 % bolus 1,000 mL (0 mLs Intravenous Stopped 09/28/18 2307)    Mobility walks with device

## 2018-09-29 NOTE — Plan of Care (Signed)
  Problem: Clinical Measurements: Goal: Diagnostic test results will improve Outcome: Progressing   Problem: Activity: Goal: Risk for activity intolerance will decrease Outcome: Progressing   Problem: Safety: Goal: Ability to remain free from injury will improve Outcome: Progressing   

## 2018-09-29 NOTE — ED Notes (Addendum)
Patient moved into hospital bed from stretcher. Bed locked and in lowest position. Called pharmacy about medications-- daily medications are to be verified and rescheduled for the morning.

## 2018-09-29 NOTE — ED Notes (Addendum)
Pt had a unwitnessed fall. Wickline, MD was standing outside the door when he heard the noise.  Pt was attempting to get out of the bed when reports she slid to the floor. Pt immediately assessed by MD with no trauma or LOC. Pt did not hit head. Pt assisted back into the bed with help of staff members. Prior interventions included yellow fall risk bracelet, yellow non-slip socks, 4 side rails up due to severe AMS per new safety hlc and door open with all staff aware of pt condition.  New interventions include a posey bed alarm. No safety sitters available to sit with pt. Pt is holding in the ED due to not having any med-surg beds

## 2018-09-29 NOTE — ED Notes (Signed)
Family member would like to be contacted when patient is admitted. Phone number is [phone].

## 2018-09-29 NOTE — ED Provider Notes (Signed)
While awaiting admission patient fell from the bed.  She does not appear to have hit her head, no LOC.  No signs of trauma to her body.  She was able to stand and was assisted back to the bed.   Ripley Fraise, MD 09/29/18 [phone]

## 2018-09-29 NOTE — ED Notes (Signed)
ED TO INPATIENT HANDOFF REPORT  Name/Age/Gender Cheyenne Mckenzie 67 y.o. female  Code Status    Code Status Orders  (From admission, onward)         Start     Ordered   09/29/18 0042  Full code  Continuous     09/29/18 0042        Code Status History    Date Active Date Inactive Code Status Order ID Comments User Context   09/20/2018 0449 09/22/2018 1419 Full Code [id number]  Ivor Costa, MD ED      Home/SNF/Other Home  Chief Complaint blood work issue  Level of Care/Admitting Diagnosis ED Disposition    ED Disposition Condition [place]: [facility] [[id number]]  Level of Care: Med-Surg [16]  Diagnosis: Acute metabolic encephalopathy [9741638]  Admitting Physician: Shela Leff [[id number]]  Attending Physician: Shela Leff [[id number]]  PT Class (Do Not Modify): Observation [104]  PT Acc Code (Do Not Modify): Observation [10022]       Medical History Past Medical History:  Diagnosis Date  . ADD (attention deficit disorder)   . Anxiety   . Depression   . Essential hypertension   . RLS (restless legs syndrome)   . Thyroid disease   . Tobacco abuse     Allergies Allergies  Allergen Reactions  . Erythromycin Base Nausea And Vomiting  . Sulfamethoxazole Nausea And Vomiting    Can only take with food    IV Location/Drains/Wounds Patient Lines/Drains/Airways Status   Active Line/Drains/Airways    Name:   Placement date:   Placement time:   Site:   Days:   Peripheral IV 09/29/18 Left Wrist   09/29/18    0434    Wrist   less than 1   External Urinary Catheter   09/20/18    0837    -   9          Labs/Imaging Results for orders placed or performed during the hospital encounter of 09/28/18 (from the past 48 hour(s))  CBC with Differential/Platelet     Status: None   Collection Time: 09/28/18  8:22 PM  Result Value Ref Range   WBC 4.6 4.0 - 10.5 K/uL   RBC 4.30 3.87 - 5.11 MIL/uL   Hemoglobin 12.9 12.0 -  15.0 g/dL   HCT 42.7 36.0 - 46.0 %   MCV 99.3 80.0 - 100.0 fL   MCH 30.0 26.0 - 34.0 pg   MCHC 30.2 30.0 - 36.0 g/dL   RDW 14.7 11.5 - 15.5 %   Platelets 199 150 - 400 K/uL   nRBC 0.0 0.0 - 0.2 %   Neutrophils Relative % 67 %   Neutro Abs 3.1 1.7 - 7.7 K/uL   Lymphocytes Relative 18 %   Lymphs Abs 0.9 0.7 - 4.0 K/uL   Monocytes Relative 11 %   Monocytes Absolute 0.5 0.1 - 1.0 K/uL   Eosinophils Relative 3 %   Eosinophils Absolute 0.1 0.0 - 0.5 K/uL   Basophils Relative 1 %   Basophils Absolute 0.1 0.0 - 0.1 K/uL   Immature Granulocytes 0 %   Abs Immature Granulocytes 0.01 0.00 - 0.07 K/uL    Comment: Performed at [facility] [place] [address], [place], [place] [address]  Comprehensive metabolic panel     Status: Abnormal   Collection Time: 09/28/18  8:22 PM  Result Value Ref Range   Sodium 139 135 - 145 mmol/L   Potassium 4.5 3.5 -  5.1 mmol/L   Chloride 105 98 - 111 mmol/L   CO2 23 22 - 32 mmol/L   Glucose, Bld 88 70 - 99 mg/dL   BUN 32 (H) 8 - 23 mg/dL   Creatinine, Ser 2.50 (H) 0.44 - 1.00 mg/dL   Calcium 11.2 (H) 8.9 - 10.3 mg/dL   Total Protein 6.7 6.5 - 8.1 g/dL   Albumin 2.9 (L) 3.5 - 5.0 g/dL   AST 140 (H) 15 - 41 U/L   ALT 48 (H) 0 - 44 U/L   Alkaline Phosphatase 324 (H) 38 - 126 U/L   Total Bilirubin 1.0 0.3 - 1.2 mg/dL   GFR calc non Af Amer 19 (L) >60 mL/min   GFR calc Af Amer 21 (L) >60 mL/min    Comment: (NOTE) The eGFR has been calculated using the CKD EPI equation. This calculation has not been validated in all clinical situations. eGFR's persistently <60 mL/min signify possible Chronic Kidney Disease.    Anion gap 11 5 - 15    Comment: Performed at [facility], [place] [address] [place], [place] [address]  Ammonia     Status: Abnormal   Collection Time: 09/28/18  8:23 PM  Result Value Ref Range   Ammonia 38 (H) 9 - 35 umol/L    Comment: Performed at [facility], [place] [address],  [place], [place] [address]  I-stat troponin, ED     Status: None   Collection Time: 09/28/18  9:11 PM  Result Value Ref Range   Troponin i, poc 0.02 0.00 - 0.08 ng/mL   Comment 3            Comment: Due to the release kinetics of cTnI, a negative result within the first hours of the onset of symptoms does not rule out myocardial infarction with certainty. If myocardial infarction is still suspected, repeat the test at appropriate intervals.   Urinalysis, Routine w reflex microscopic     Status: Abnormal   Collection Time: 09/29/18  6:03 AM  Result Value Ref Range   Color, Urine YELLOW YELLOW   APPearance CLEAR CLEAR   Specific Gravity, Urine 1.012 1.005 - 1.030   pH 5.0 5.0 - 8.0   Glucose, UA NEGATIVE NEGATIVE mg/dL   Hgb urine dipstick NEGATIVE NEGATIVE   Bilirubin Urine NEGATIVE NEGATIVE   Ketones, ur NEGATIVE NEGATIVE mg/dL   Protein, ur NEGATIVE NEGATIVE mg/dL   Nitrite NEGATIVE NEGATIVE   Leukocytes, UA SMALL (A) NEGATIVE   RBC / HPF 0-5 0 - 5 RBC/hpf   WBC, UA 6-10 0 - 5 WBC/hpf   Bacteria, UA RARE (A) NONE SEEN   Mucus PRESENT    Hyaline Casts, UA PRESENT     Comment: Performed at [facility], [place] [address], [place], [place] [address]  Hepatic function panel     Status: Abnormal   Collection Time: 09/29/18  6:20 AM  Result Value Ref Range   Total Protein 5.7 (L) 6.5 - 8.1 g/dL   Albumin 2.5 (L) 3.5 - 5.0 g/dL   AST 134 (H) 15 - 41 U/L   ALT 47 (H) 0 - 44 U/L   Alkaline Phosphatase 340 (H) 38 - 126 U/L   Total Bilirubin 0.8 0.3 - 1.2 mg/dL   Bilirubin, Direct 0.5 (H) 0.0 - 0.2 mg/dL   Indirect Bilirubin 0.3 0.3 - 0.9 mg/dL    Comment: Performed at [facility], [place] [address] [place], [place] [address]   Dg  Chest 2 View  Result Date: 09/28/2018 CLINICAL DATA:  Altered mental status EXAM: CHEST - 2 VIEW COMPARISON:  09/20/2018 FINDINGS: Trace pleural effusions on the lateral view. Mild cardiomegaly. No acute consolidation. No  pneumothorax. IMPRESSION: Cardiomegaly.  Trace pleural effusions. Electronically Signed   By: Donavan Foil M.D.   On: 09/28/2018 20:53   Ct Head Wo Contrast  Result Date: 09/28/2018 CLINICAL DATA:  Altered level of consciousness. Recent diagnosis of stage IV breast cancer. EXAM: CT HEAD WITHOUT CONTRAST TECHNIQUE: Contiguous axial images were obtained from the base of the skull through the vertex without intravenous contrast. COMPARISON:  MRI of the brain 09/20/2018. FINDINGS: Brain: Mild atrophy and white matter disease is present. No acute infarct or hemorrhage is present. There is no mass lesion. 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 Ventricles are proportionate to the degree of atrophy. No significant extra-axial fluid collection is present. Vascular: Atherosclerotic calcifications are present within the cavernous internal carotid arteries. There is no hyperdense vessel. Skull: Calvarium is intact. There is thinning of the scalp near the vertex. Sinuses/Orbits: The paranasal sinuses and mastoid air cells are clear. Globes and orbits are within normal limits. IMPRESSION: 1. No acute intracranial abnormality. 2. No evidence of metastatic disease the brain. 3. Stable mild atrophy and white matter disease. Electronically Signed   By: San Morelle M.D.   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 now present Confirmed by Delora Fuel ([id number]) on 09/29/2018 1:56:37 AM   Pending Labs Unresulted Labs (From  admission, onward)    Start     Ordered   09/29/18 0500  Vitamin B12  Tomorrow morning,   R     09/29/18 0042   09/29/18 0500  Calcium, ionized  Tomorrow morning,   R     09/29/18 0059          Vitals/Pain Today's Vitals   09/29/18 0600 09/29/18 0630 09/29/18 0700 09/29/18 0730  BP: (!) 156/112 (!) 148/103 (!) 172/108 (!) 153/106  Pulse: (!) 104 100 (!) 101   Resp: (!) 25 (!) 21 (!) 24 20  Temp:      TempSrc:      SpO2: 97% 97% 98% (!) 85%  Weight:      Height:      PainSc:        Isolation Precautions No active isolations  Medications Medications  0.9 %  sodium chloride infusion ( Intravenous Hold 09/29/18 0232)  letrozole [facility]) tablet 2.5 mg (has no administration in time range)  palbociclib (IBRANCE) capsule 125 mg (has no administration in time range)  amphetamine-dextroamphetamine (ADDERALL) tablet 30 mg (0 tablets Oral Hold 09/29/18 0233)  PARoxetine (PAXIL) tablet 40 mg (has no administration in time range)  senna-docusate (Senokot-S) tablet 1 tablet (has no administration in time range)  lactulose (CHRONULAC) 10 GM/15ML solution 10 g (0 g Oral Hold 09/29/18 0233)  levothyroxine (SYNTHROID, LEVOTHROID) tablet 150 mcg (150 mcg Oral Given 09/29/18 0625)  hydrALAZINE (APRESOLINE) injection 5 mg (5 mg Intravenous Given 09/29/18 0411)  enoxaparin (LOVENOX) injection  30 mg (has no administration in time range)  sodium chloride 0.9 % bolus 1,000 mL (0 mLs Intravenous Stopped 09/28/18 2307)    Mobility walks with person assist

## 2018-09-29 NOTE — ED Notes (Signed)
Urine and culture sent to lab  

## 2018-09-30 DIAGNOSIS — L8990 Pressure ulcer of unspecified site, unspecified stage: Secondary | ICD-10-CM

## 2018-09-30 LAB — COMPREHENSIVE METABOLIC PANEL
ALT: 46 U/L — ABNORMAL HIGH (ref 0–44)
ANION GAP: 10 (ref 5–15)
AST: 135 U/L — ABNORMAL HIGH (ref 15–41)
Albumin: 2.6 g/dL — ABNORMAL LOW (ref 3.5–5.0)
Alkaline Phosphatase: 330 U/L — ABNORMAL HIGH (ref 38–126)
BILIRUBIN TOTAL: 1.2 mg/dL (ref 0.3–1.2)
BUN: 25 mg/dL — ABNORMAL HIGH (ref 8–23)
CO2: 18 mmol/L — ABNORMAL LOW (ref 22–32)
Calcium: 9.6 mg/dL (ref 8.9–10.3)
Chloride: 105 mmol/L (ref 98–111)
Creatinine, Ser: 1.98 mg/dL — ABNORMAL HIGH (ref 0.44–1.00)
GFR calc Af Amer: 28 mL/min — ABNORMAL LOW (ref >60)
GFR, EST NON AFRICAN AMERICAN: 25 mL/min — AB (ref >60)
Glucose, Bld: 95 mg/dL (ref 70–99)
POTASSIUM: 3.7 mmol/L (ref 3.5–5.1)
Sodium: 133 mmol/L — ABNORMAL LOW (ref 135–145)
TOTAL PROTEIN: 5.9 g/dL — AB (ref 6.5–8.1)

## 2018-09-30 LAB — CBC
HEMATOCRIT: 35.7 % — AB (ref 36.0–46.0)
Hemoglobin: 11 g/dL — ABNORMAL LOW (ref 12.0–15.0)
MCH: 30.6 pg (ref 26.0–34.0)
MCHC: 30.8 g/dL (ref 30.0–36.0)
MCV: 99.4 fL (ref 80.0–100.0)
Platelets: 160 10*3/uL (ref 150–400)
RBC: 3.59 MIL/uL — ABNORMAL LOW (ref 3.87–5.11)
RDW: 14.6 % (ref 11.5–15.5)
WBC: 4.9 10*3/uL (ref 4.0–10.5)
nRBC: 0 % (ref 0.0–0.2)

## 2018-09-30 LAB — URINE CULTURE

## 2018-09-30 LAB — CALCIUM, IONIZED: CALCIUM, IONIZED, SERUM: 6.1 mg/dL — AB (ref 4.5–5.6)

## 2018-09-30 LAB — TSH: TSH: 3.548 u[IU]/mL (ref 0.350–4.500)

## 2018-09-30 LAB — AMMONIA: AMMONIA: 38 umol/L — AB (ref 9–35)

## 2018-09-30 MED ORDER — SODIUM BICARBONATE 650 MG PO TABS
650.0000 mg | ORAL_TABLET | Freq: Every day | ORAL | Status: DC
  Administered 2018-09-30 – 2018-10-05 (×6): 650 mg via ORAL
  Filled 2018-09-30 (×6): qty 1

## 2018-09-30 MED ORDER — HYDRALAZINE HCL 10 MG PO TABS
10.0000 mg | ORAL_TABLET | Freq: Two times a day (BID) | ORAL | Status: DC
  Administered 2018-09-30 – 2018-10-01 (×3): 10 mg via ORAL
  Filled 2018-09-30 (×3): qty 1

## 2018-09-30 MED ORDER — SODIUM CHLORIDE 0.9 % IV SOLN
1.0000 g | INTRAVENOUS | Status: DC
  Administered 2018-09-30 – 2018-10-01 (×2): 1 g via INTRAVENOUS
  Filled 2018-09-30 (×2): qty 1

## 2018-09-30 MED ORDER — ALPRAZOLAM 0.25 MG PO TABS
0.2500 mg | ORAL_TABLET | Freq: Once | ORAL | Status: AC
  Administered 2018-09-30: 0.25 mg via ORAL
  Filled 2018-09-30: qty 1

## 2018-09-30 MED ORDER — ALPRAZOLAM 0.5 MG PO TABS
0.5000 mg | ORAL_TABLET | Freq: Two times a day (BID) | ORAL | Status: DC | PRN
  Administered 2018-09-30 – 2018-10-04 (×6): 0.5 mg via ORAL
  Filled 2018-09-30 (×6): qty 1

## 2018-09-30 MED ORDER — SODIUM BICARBONATE 650 MG PO TABS: 650.0000 mg | ORAL_TABLET | Freq: Three times a day (TID) | ORAL | Status: DC

## 2018-09-30 MED ORDER — AMLODIPINE BESYLATE 5 MG PO TABS: 5.0000 mg | ORAL_TABLET | Freq: Every day | ORAL | Status: DC

## 2018-09-30 MED ORDER — TRAMADOL HCL 50 MG PO TABS
50.0000 mg | ORAL_TABLET | Freq: Two times a day (BID) | ORAL | Status: DC | PRN
  Administered 2018-09-30 – 2018-10-04 (×3): 50 mg via ORAL
  Filled 2018-09-30 (×3): qty 1

## 2018-09-30 NOTE — Plan of Care (Signed)
  Problem: Pain Managment: Goal: General experience of comfort will improve Outcome: Progressing   

## 2018-09-30 NOTE — Plan of Care (Signed)
  Problem: Health Behavior/Discharge Planning: Goal: Ability to manage health-related needs will improve Outcome: Progressing   Problem: Clinical Measurements: Goal: Diagnostic test results will improve Outcome: Progressing   Problem: Activity: Goal: Risk for activity intolerance will decrease Outcome: Progressing   Problem: Safety: Goal: Ability to remain free from injury will improve Outcome: Progressing

## 2018-09-30 NOTE — Progress Notes (Signed)
PROGRESS NOTE    Cheyenne Mckenzie  IZT:[id number] DOB: 01-25-1951 DOA: 09/28/2018 PCP: Stephens Shire, MD    Brief Narrative: Cheyenne Mckenzie is a 67 y.o. female with medical history significant of anxiety, ADD, depression, HTN, CKD 3 presenting to the hospital for evaluation of altered mental status.  Per sister and roommate at bedside patient has been confused since her previous hospitalization.  States her appetite is okay but she she has not been consuming enough fluid due to her confusion.  She has been sleeping all the time and having generalized weakness.  Per roommate, when patient speaks it sounds like a "word salad."  She has not had any fevers, chills, nausea, vomiting, or diarrhea.  They believe her urine appears dark.  States Dr. Arnoldo Lenis office advised them to bring the patient into the hospital due to abnormal labs.  Patient has not started taking her cancer pain medications yet.  Family thinks her mental status has improved since she has received IV fluids in the ED.  Patient was recently admitted from September 19, 2018 to September 22, 2018.  CT chest revealed a right-sided breast mass which was thought to be likely breast cancer with liver mets, status post liver biopsy.  Patient was also encephalopathic and MRI brain did not show any acute findings.  Ammonia was only mildly elevated at 41.  Liver biopsy revealed metastatic carcinoma.  Patient was seen by Dr. Sonny Dandy on September 27, 2018.  ED Course: Afebrile and remainder of vitals stable on arrival.  CBC normal.  Creatinine 2.5, was 2.1 two days ago.  Corrected calcium 12.1; was 12.5 two days ago.  AST 140, ALT 48, alk phos 324, and T bili 1.0.  LFTs improved compared to labs done 2 days ago.  Ammonia mildly elevated at 38.  UA pending.  I-STAT troponin negative and EKG not suggestive of ACS.  Chest x-ray showing mild cardiomegaly and trace pleural effusions.  CT head showing no acute abnormality or evidence of metastatic disease to the  brain. TRH paged to admit.     Assessment & Plan:   Principal Problem:   Acute metabolic encephalopathy Active Problems:   ADD (attention deficit disorder)   Depression with anxiety   Hypothyroidism   Acute renal failure superimposed on stage 3 chronic kidney disease (HCC)   Hypercalcemia   Essential hypertension   Elevated liver function tests   Metastatic breast cancer (HCC)   Pressure injury of skin   1-Acute on chronic renal failure stage III;  Prior cr 1.4.  Presents with cr at 2.1. --1.9 Could be related to hypovolemia.  Continue with IV fluid.  Renal US; no hydronephrosis.  She is incontinent. Urine out put not accurate.  Improving.  Start sodium bicarb,   2-Acute metabolic encephalopathy; component of hepatic encephalopathy;  Related to dehydration, vs component of hepatic encephalopathy  Ammonia mildly elevated.  Continue with IV fluids.  Lactulose BID>  CT head negative.  Treat for infection; UA with 6-10 WBC. Follow urine culture.  B 12 elevated, TSH normal;.   Hypercalcemia; malignancy  continue with IV fluid.  Calcium trending down,   Transaminases; Secondary to metastasis diseases to liver.   Right side metastatic  Breast cancer, metastasis to liver,  Follow with Dr Lindi Adie, added to rounding list.  Started on Ibrace abd letrozole.   Anxiety, depression -Continue  Paxil -resume benzodiazepine   ADD -Continue home Adderall  Hypothyroidism Patient is currently on Synthroid 137 mcg daily.  TSH checked  on October 30 was mildly elevated at 4.85; no dose change.  -Dose Synthroid to 150 mcg daily.     HTN -Currently not on any home medications. -IV hydralazine 5 mg every 4 hours as needed for SBP >160 Schedule hydralazine.   RN Pressure Injury Documentation: Pressure Injury Deep Tissue Injury - Purple or maroon localized area of discolored intact skin or blood-filled blister due to damage of underlying soft tissue from pressure and/or  shear. (Active)      Location: Heel  Location Orientation: Right  Staging: Deep Tissue Injury - Purple or maroon localized area of discolored intact skin or blood-filled blister due to damage of underlying soft tissue from pressure and/or shear.  Wound Description (Comments):   Present on Admission:     Malnutrition Type:      Malnutrition Characteristics:      Nutrition Interventions:     Estimated body mass index is 21.24 kg/m as calculated from the following:   Height as of this encounter: 5' 8"  (1.727 m).   Weight as of this encounter: 63.4 kg.   DVT prophylaxis: lovenox Code Status: full code.  Family Communication: no family at bedside.  Disposition Plan: remain in the hospital for IV fluids not adequate oral intake . Monitor renal function. PT evaluation    Consultants:   Dr Lindi Adie , added to rounding team.    Procedures:  Renal US. Generalized cortical thinning of the right kidney. No acute  abnormalities identified in both kidneys.   Antimicrobials:   None    Subjective: Alert, oriented to person, place.  Still confuse.  Not eating a lot per nursing  staff   Objective: Vitals:   09/29/18 1627 09/29/18 2136 09/30/18 0300 09/30/18 0551  BP: (!) 166/104 (!) 125/105 (!) 165/111 (!) 161/102  Pulse: 93 90 (!) 106 87  Resp:  18 20 20   Temp:  98.4 F (36.9 C) 98.3 F (36.8 C) 98.6 F (37 C)  TempSrc:  Oral Oral Oral  SpO2:  96% 95%   Weight:      Height:        Intake/Output Summary (Last 24 hours) at 09/30/2018 8185 Last data filed at 09/30/2018 6314 Gross per 24 hour  Intake 2532.94 ml  Output 300 ml  Net 2232.94 ml   Filed Weights   09/28/18 1910  Weight: 63.4 kg    Examination:  General exam: NAD Respiratory system: CTA Cardiovascular system: S 1, S 2 RRR Gastrointestinal system: BS present, soft, nt Central nervous system: alert, confuse.  Extremities: symmetric power.  Skin: No rashes.     Data Reviewed: I have  personally reviewed following labs and imaging studies  CBC: Recent Labs  Lab 09/27/18 1335 09/28/18 2022 09/29/18 1040 09/30/18 0355  WBC 4.5 4.6 5.7 4.9  NEUTROABS 3.2 3.1  --   --   HGB 13.4 12.9 12.6 11.0*  HCT 43.1 42.7 41.3 35.7*  MCV 98.6 99.3 101.5* 99.4  PLT 177 199 166 970   Basic Metabolic Panel: Recent Labs  Lab 09/27/18 1335 09/28/18 2022 09/29/18 1040 09/30/18 0355  NA 140 139 137 133*  K 4.3 4.5 4.1 3.7  CL 105 105 105 105  CO2 23 23 22  18*  GLUCOSE 119* 88 97 95  BUN 27* 32* 31* 25*  CREATININE 2.11* 2.50* 2.31* 1.98*  CALCIUM 11.3* 11.2* 10.8* 9.6   GFR: Estimated Creatinine Clearance: 26.8 mL/min (A) (by C-G formula based on SCr of 1.98 mg/dL (H)). Liver Function Tests: Recent Labs  Lab 09/27/18 1335 09/28/18 2022 09/29/18 0620 09/30/18 0355  AST 160* 140* 134* 135*  ALT 54* 48* 47* 46*  ALKPHOS 406* 324* 340* 330*  BILITOT 1.1 1.0 0.8 1.2  PROT 6.5 6.7 5.7* 5.9*  ALBUMIN 2.5* 2.9* 2.5* 2.6*   No results for input(s): LIPASE, AMYLASE in the last 168 hours. Recent Labs  Lab 09/27/18 1334 09/28/18 2023 09/30/18 0355  AMMONIA 45* 38* 38*   Coagulation Profile: No results for input(s): INR, PROTIME in the last 168 hours. Cardiac Enzymes: No results for input(s): CKTOTAL, CKMB, CKMBINDEX, TROPONINI in the last 168 hours. BNP (last 3 results) No results for input(s): PROBNP in the last 8760 hours. HbA1C: No results for input(s): HGBA1C in the last 72 hours. CBG: No results for input(s): GLUCAP in the last 168 hours. Lipid Profile: No results for input(s): CHOL, HDL, LDLCALC, TRIG, CHOLHDL, LDLDIRECT in the last 72 hours. Thyroid Function Tests: No results for input(s): TSH, T4TOTAL, FREET4, T3FREE, THYROIDAB in the last 72 hours. Anemia Panel: Recent Labs    09/29/18 0620  VITAMINB12 >7,500*   Sepsis Labs: No results for input(s): PROCALCITON, LATICACIDVEN in the last 168 hours.  Recent Results (from the past 240 hour(s))    Urine Culture     Status: None   Collection Time: 09/27/18  1:14 PM  Result Value Ref Range Status   Specimen Description   Final    URINE, CLEAN CATCH Performed at [facility], [address], [place], [place] [address]    Special Requests   Final    NONE Performed at [facility], [place] [address], [place], [place] [address]    Culture   Final    NO GROWTH Performed at [facility], [place] [address], [place], [place] [address]    Report Status 09/28/2018 FINAL  Final         Radiology Studies: Dg Chest 2 View  Result Date: 09/28/2018 CLINICAL DATA:  Altered mental status EXAM: CHEST - 2 VIEW COMPARISON:  09/20/2018 FINDINGS: Trace pleural effusions on the lateral view. Mild cardiomegaly. No acute consolidation. No pneumothorax. IMPRESSION: Cardiomegaly.  Trace pleural effusions. Electronically Signed   By: Donavan Foil M.D.   On: 09/28/2018 20:53   Ct Head Wo Contrast  Result Date: 09/28/2018 CLINICAL DATA:  Altered level of consciousness. Recent diagnosis of stage IV breast cancer. EXAM: CT HEAD WITHOUT CONTRAST TECHNIQUE: Contiguous axial images were obtained from the base of the skull through the vertex without intravenous contrast. COMPARISON:  MRI of the brain 09/20/2018. FINDINGS: Brain: Mild atrophy and white matter disease is present. No acute infarct or hemorrhage is present. There is no mass lesion. 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 Ventricles are proportionate to the degree of atrophy. No significant extra-axial fluid collection is present. Vascular: Atherosclerotic calcifications are present within the cavernous internal carotid arteries. There is no hyperdense vessel. Skull: Calvarium is intact. There is thinning of the scalp near the vertex. Sinuses/Orbits: The paranasal sinuses and mastoid air cells are clear. Globes and orbits are within normal limits. IMPRESSION: 1. No  acute intracranial abnormality. 2. No evidence of metastatic disease the brain. 3. Stable mild atrophy and white matter disease. Electronically Signed   By: San Morelle M.D.   On: 09/28/2018 20:55   US Renal  Result Date: 09/29/2018 CLINICAL DATA:  Acute kidney failure. EXAM: RENAL / URINARY TRACT ULTRASOUND COMPLETE COMPARISON:  None. FINDINGS: Right Kidney:  Renal measurements: 10.5 x 3.3 x 5.1 cm = volume: 90.9 mL . Echogenicity within normal limits. No mass or hydronephrosis visualized. There is generalized cortical thinning. Left Kidney: Renal measurements: 10.5 x 4.4 x 4.4 cm = volume: 106.3 mL. Echogenicity within normal limits. No hydronephrosis visualized. There is a 1 x 0.7 x 0.7 cm simple cyst in the upper pole left kidney. Bladder: Appears normal for degree of bladder distention. IMPRESSION: Generalized cortical thinning of the right kidney. No acute abnormalities identified in both kidneys. Electronically Signed   By: Abelardo Diesel M.D.   On: 09/29/2018 13:13        Scheduled Meds: . amphetamine-dextroamphetamine  30 mg Oral BID  . enoxaparin (LOVENOX) injection  30 mg Subcutaneous Q24H  . feeding supplement (ENSURE ENLIVE)  237 mL Oral BID BM  . hydrALAZINE  10 mg Oral BID  . lactulose  20 g Oral BID  . letrozole  2.5 mg Oral Daily  . levothyroxine  150 mcg Oral Q0600  . palbociclib  125 mg Oral Q breakfast  . PARoxetine  40 mg Oral Daily  . sodium bicarbonate  650 mg Oral TID   Continuous Infusions: . sodium chloride 100 mL/hr at 09/30/18 0340  . cefTRIAXone (ROCEPHIN)  IV       LOS: 1 day    Time spent: 35 minutes.     Elmarie Shiley, MD [facility] Pager [phone]  If 7PM-7AM, please contact night-coverage [email or web address] Password [email or web address] 09/30/2018, 9:28 AM

## 2018-09-30 NOTE — Progress Notes (Signed)
   09/30/18 0300  What Happened  Was fall witnessed? No  Was patient injured? No  Patient found on floor  Found by Staff-comment  Stated prior activity to/from bed, chair, or stretcher  Follow Up  MD notified [place]   Time MD notified 0300  Adult Fall Risk Assessment  Risk Factor Category (scoring not indicated) Fall has occurred during this admission (document High fall risk)  Patient Fall Risk Level High fall risk  Adult Fall Risk Interventions  Required Bundle Interventions *See Row Information* High fall risk - low, moderate, and high requirements implemented  Additional Interventions Reorient/diversional activities with confused patients;PT/OT need assessed if change in mobility from baseline;Room near nurses station;Use of appropriate toileting equipment (bedpan, BSC, etc.)  Screening for Fall Injury Risk (To be completed on HIGH fall risk patients) - Assessing Need for Low Bed  Risk For Fall Injury- Low Bed Criteria Previous fall this admission  Will Implement Low Bed and Floor Mats No - Criteria no longer met for low bed;Low bed contraindicated, floor mats in place  Screening for Fall Injury Risk (To be completed on HIGH fall risk patients who do not meet crieteria for Low Bed) - Assessing Need for Floor Mats Only  Risk For Fall Injury- Criteria for Floor Mats Confusion/dementia (+CAM, CIWA, TBI, etc.)  Will Implement Floor Mats Yes  Vitals  Temp 98.3 F (36.8 C)  Temp Source Oral  BP (!) 165/111  MAP (mmHg) 126  BP Location Right Arm  BP Method Automatic  Patient Position (if appropriate) Sitting  Pulse Rate (!) 106  Pulse Rate Source Monitor  Resp 20  Oxygen Therapy  SpO2 95 %  O2 Device Room Air  Pain Assessment  Pain Scale 0-10  Pain Score 0  Neurological  Neuro (WDL) X  Level of Consciousness Alert  Orientation Level Oriented to person;Disoriented to place;Disoriented to time;Disoriented to situation  Cognition Appropriate at baseline;Appropriate  attention/concentration;Follows commands;Poor safety awareness;Poor Journalist, newspaper  Musculoskeletal  Musculoskeletal (WDL) X  Assistive Device BSC  Generalized Weakness Yes  Musculoskeletal Details  RUE Full movement  LUE Full movement  RLE Full movement;Weakness  LLE Full movement;Weakness  Integumentary  Integumentary (WDL) X  Skin Color Appropriate for ethnicity  Skin Condition Dry  Skin Integrity Cellulitis;Other (Comment)  Abrasion Location Sacrum;Arm;Toe (Comment  which one)  Abrasion Location Orientation Other (Comment) (right lateral forearm)  Cellulitis Location Breast  Cellulitis Location Orientation Right  Cellulitis Intervention Foam;Gauze  Ecchymosis Location Arm;Sacrum  Ecchymosis Location Orientation Right;Left  Skin Turgor Non-tenting  Pain Assessment  Work-Related Injury No

## 2018-10-01 ENCOUNTER — Inpatient Hospital Stay (HOSPITAL_COMMUNITY): Payer: Medicare Other

## 2018-10-01 LAB — CBC
HCT: 37.4 % (ref 36.0–46.0)
HEMOGLOBIN: 11.5 g/dL — AB (ref 12.0–15.0)
MCH: 30.7 pg (ref 26.0–34.0)
MCHC: 30.7 g/dL (ref 30.0–36.0)
MCV: 100 fL (ref 80.0–100.0)
NRBC: 0 % (ref 0.0–0.2)
PLATELETS: 203 10*3/uL (ref 150–400)
RBC: 3.74 MIL/uL — AB (ref 3.87–5.11)
RDW: 14.9 % (ref 11.5–15.5)
WBC: 6.1 10*3/uL (ref 4.0–10.5)

## 2018-10-01 LAB — BASIC METABOLIC PANEL
ANION GAP: 9 (ref 5–15)
BUN: 20 mg/dL (ref 8–23)
CALCIUM: 9.5 mg/dL (ref 8.9–10.3)
CO2: 21 mmol/L — ABNORMAL LOW (ref 22–32)
Chloride: 105 mmol/L (ref 98–111)
Creatinine, Ser: 1.86 mg/dL — ABNORMAL HIGH (ref 0.44–1.00)
GFR calc Af Amer: 31 mL/min — ABNORMAL LOW (ref >60)
GFR calc non Af Amer: 27 mL/min — ABNORMAL LOW (ref >60)
GLUCOSE: 92 mg/dL (ref 70–99)
POTASSIUM: 3.9 mmol/L (ref 3.5–5.1)
Sodium: 135 mmol/L (ref 135–145)

## 2018-10-01 MED ORDER — DOXYCYCLINE HYCLATE 100 MG PO TABS
100.0000 mg | ORAL_TABLET | Freq: Two times a day (BID) | ORAL | Status: DC
  Administered 2018-10-01 – 2018-10-03 (×6): 100 mg via ORAL
  Filled 2018-10-01 (×6): qty 1

## 2018-10-01 MED ORDER — FAMOTIDINE IN NACL 20-0.9 MG/50ML-% IV SOLN
20.0000 mg | Freq: Every day | INTRAVENOUS | Status: DC
  Administered 2018-10-01 – 2018-10-02 (×2): 20 mg via INTRAVENOUS
  Filled 2018-10-01 (×2): qty 50

## 2018-10-01 MED ORDER — FLUCONAZOLE IN SODIUM CHLORIDE 200-0.9 MG/100ML-% IV SOLN
200.0000 mg | INTRAVENOUS | Status: AC
  Administered 2018-10-01: 200 mg via INTRAVENOUS
  Filled 2018-10-01: qty 100

## 2018-10-01 MED ORDER — FLUCONAZOLE 100MG IVPB
100.0000 mg | INTRAVENOUS | Status: DC
  Administered 2018-10-02 – 2018-10-03 (×2): 100 mg via INTRAVENOUS
  Filled 2018-10-01 (×3): qty 50

## 2018-10-01 MED ORDER — HYDRALAZINE HCL 25 MG PO TABS
25.0000 mg | ORAL_TABLET | Freq: Two times a day (BID) | ORAL | Status: DC
  Administered 2018-10-01 – 2018-10-02 (×3): 25 mg via ORAL
  Filled 2018-10-01 (×3): qty 1

## 2018-10-01 MED ORDER — BIOTENE DRY MOUTH MT LIQD: 15.0000 mL | OROMUCOSAL | Status: DC | PRN

## 2018-10-01 NOTE — Progress Notes (Signed)
PROGRESS NOTE    [name]  SLH:[id number] DOB: [birth year] DOA: 09/28/2018 PCP: Stephens Shire, MD    Brief Narrative: Cheyenne Mckenzie is a 67 y.o. female with medical history significant of anxiety, ADD, depression, HTN, CKD 3 presenting to the hospital for evaluation of altered mental status.  Per sister and roommate at bedside patient has been confused since her previous hospitalization.  States her appetite is okay but she she has not been consuming enough fluid due to her confusion.  She has been sleeping all the time and having generalized weakness.  Per roommate, when patient speaks it sounds like a "word salad."  She has not had any fevers, chills, nausea, vomiting, or diarrhea.  They believe her urine appears dark.  States Dr. Arnoldo Lenis office advised them to bring the patient into the hospital due to abnormal labs.  Patient has not started taking her cancer pain medications yet.  Family thinks her mental status has improved since she has received IV fluids in the ED.  Patient was recently admitted from September 19, 2018 to September 22, 2018.  CT chest revealed a right-sided breast mass which was thought to be likely breast cancer with liver mets, status post liver biopsy.  Patient was also encephalopathic and MRI brain did not show any acute findings.  Ammonia was only mildly elevated at 41.  Liver biopsy revealed metastatic carcinoma.  Patient was seen by Dr. Sonny Dandy on September 27, 2018.  ED Course: Afebrile and remainder of vitals stable on arrival.  CBC normal.  Creatinine 2.5, was 2.1 two days ago.  Corrected calcium 12.1; was 12.5 two days ago.  AST 140, ALT 48, alk phos 324, and T bili 1.0.  LFTs improved compared to labs done 2 days ago.  Ammonia mildly elevated at 38.  UA pending.  I-STAT troponin negative and EKG not suggestive of ACS.  Chest x-ray showing mild cardiomegaly and trace pleural effusions.  CT head showing no acute abnormality or evidence of metastatic disease to the  brain. TRH paged to admit.     Assessment & Plan:   Principal Problem:   Acute metabolic encephalopathy Active Problems:   ADD (attention deficit disorder)   Depression with anxiety   Hypothyroidism   Acute renal failure superimposed on stage 3 chronic kidney disease (HCC)   Hypercalcemia   Essential hypertension   Elevated liver function tests   Metastatic breast cancer (HCC)   Pressure injury of skin   1-Acute on chronic renal failure stage III;  Prior cr 1.4.  Presents with cr at 2.1. --1.9--1.8 Could be related to hypovolemia.  Continue with IV fluid.  Renal US; no hydronephrosis.  She is incontinent. Urine out put not accurate.  Improving.  Started  sodium bicarb.   2-Acute metabolic encephalopathy; component of hepatic encephalopathy;  Related to dehydration, vs component of hepatic encephalopathy  Ammonia mildly elevated.  Continue with IV fluids.  Lactulose BID>  CT head negative.  Treat for infection; UA with 6-10 WBC. Follow urine culture. No significant growth. Will discontinue ceftriaxone.  B 12 elevated, TSH normal;.   Hypercalcemia; malignancy  continue with IV fluid.  Calcium trending down,   Transaminases; Secondary to metastasis diseases to liver.   Right side metastatic  Breast cancer, metastasis to liver,  Follow with Dr Lindi Adie, added to rounding list.  Started on Ibrace abd letrozole.  Breast mass, open wound with mild amount of drainage. Star doxy.   Anxiety, depression -Continue  Paxil -resume benzodiazepine  ADD -Continue home Adderall  Hypothyroidism Patient is currently on Synthroid 137 mcg daily.  TSH checked on October 30 was mildly elevated at 4.85; no dose change.  -Dose Synthroid to 150 mcg daily.    Mild dyspnea; check Chest x ray.   Odynophagia; start diflucan   HTN -Currently not on any home medications. -IV hydralazine 5 mg every 4 hours as needed for SBP >160 Schedule hydralazine.   RN Pressure Injury  Documentation: Pressure Injury 09/29/18 Deep Tissue Injury - Purple or maroon localized area of discolored intact skin or blood-filled blister due to damage of underlying soft tissue from pressure and/or shear. (Active)  09/29/18 0837   Location: Heel  Location Orientation: Right  Staging: Deep Tissue Injury - Purple or maroon localized area of discolored intact skin or blood-filled blister due to damage of underlying soft tissue from pressure and/or shear.  Wound Description (Comments):   Present on Admission: Yes    Malnutrition Type:      Malnutrition Characteristics:      Nutrition Interventions:     Estimated body mass index is 21.24 kg/m as calculated from the following:   Height as of this encounter: _0  (1.727 m).   Weight as of this encounter: 63.4 kg.   DVT prophylaxis: lovenox Code Status: full code.  Family Communication: no family at bedside.  Disposition Plan: remain in the hospital for IV fluids not adequate oral intake . Monitor renal function. PT evaluation    Consultants:   Dr Lindi Adie , added to rounding team.    Procedures:  Renal US. Generalized cortical thinning of the right kidney. No acute  abnormalities identified in both kidneys.   Antimicrobials:   None    Subjective: Report mild dyspnea.  Sore throat, pain swallowing.   Objective: Vitals:   09/30/18 0551 09/30/18 1358 09/30/18 2100 10/01/18 0500  BP: (!) 161/102 115/75 (!) 152/106 (!) 156/104  Pulse: 87 92 88 84  Resp: _1 Temp: 98.6 F (37 C) 98.7 F (37.1 C) 97.8 F (36.6 C) 98 F (36.7 C)  TempSrc: Oral Axillary Oral Oral  SpO2:  99% 98% 99%  Weight:      Height:        Intake/Output Summary (Last 24 hours) at 10/01/2018 1009 Last data filed at 10/01/2018 0400 Gross per 24 hour  Intake 2062.35 ml  Output 0 ml  Net 2062.35 ml   Filed Weights   09/28/18 1910  Weight: 63.4 kg    Examination:  General exam: NAD Respiratory system:  CTA Cardiovascular system: S 1, S  2 rrr Gastrointestinal system: BS present soft. nt Central nervous system: alert. confuse  Extremities: Symmetric power.  Skin: right breast with mass, open wound, draining     Data Reviewed: I have personally reviewed following labs and imaging studies  CBC: Recent Labs  Lab 09/27/18 1335 09/28/18 2022 09/29/18 1040 09/30/18 0355 10/01/18 0452  WBC 4.5 4.6 5.7 4.9 6.1  NEUTROABS 3.2 3.1  --   --   --   HGB 13.4 12.9 12.6 11.0* 11.5*  HCT 43.1 42.7 41.3 35.7* 37.4  MCV 98.6 99.3 101.5* 99.4 100.0  PLT 177 199 166 160 161   Basic Metabolic Panel: Recent Labs  Lab 09/27/18 1335 09/28/18 2022 09/29/18 1040 09/30/18 0355 10/01/18 0452  NA 140 139 137 133* 135  K 4.3 4.5 4.1 3.7 3.9  CL 105 105 105 105 105  CO2 _2 18* 21*  GLUCOSE 119* 88  97 95 92  BUN 27* 32* 31* 25* 20  CREATININE 2.11* 2.50* 2.31* 1.98* 1.86*  CALCIUM 11.3* 11.2* 10.8* 9.6 9.5   GFR: Estimated Creatinine Clearance: 28.6 mL/min (A) (by C-G formula based on SCr of 1.86 mg/dL (H)). Liver Function Tests: Recent Labs  Lab 09/27/18 1335 09/28/18 2022 09/29/18 0620 09/30/18 0355  AST 160* 140* 134* 135*  ALT 54* 48* 47* 46*  ALKPHOS 406* 324* 340* 330*  BILITOT 1.1 1.0 0.8 1.2  PROT 6.5 6.7 5.7* 5.9*  ALBUMIN 2.5* 2.9* 2.5* 2.6*   No results for input(s): LIPASE, AMYLASE in the last 168 hours. Recent Labs  Lab 09/27/18 1334 09/28/18 2023 09/30/18 0355  AMMONIA 45* 38* 38*   Coagulation Profile: No results for input(s): INR, PROTIME in the last 168 hours. Cardiac Enzymes: No results for input(s): CKTOTAL, CKMB, CKMBINDEX, TROPONINI in the last 168 hours. BNP (last 3 results) No results for input(s): PROBNP in the last 8760 hours. HbA1C: No results for input(s): HGBA1C in the last 72 hours. CBG: No results for input(s): GLUCAP in the last 168 hours. Lipid Profile: No results for input(s): CHOL, HDL, LDLCALC, TRIG, CHOLHDL, LDLDIRECT in the  last 72 hours. Thyroid Function Tests: Recent Labs    09/30/18 1030  TSH 3.548   Anemia Panel: Recent Labs    09/29/18 0620  VITAMINB12 >7,500*   Sepsis Labs: No results for input(s): PROCALCITON, LATICACIDVEN in the last 168 hours.  Recent Results (from the past 240 hour(s))  Urine Culture     Status: None   Collection Time: 09/27/18  1:14 PM  Result Value Ref Range Status   Specimen Description   Final    URINE, CLEAN CATCH Performed at [facility], [address] [place], [place] [address]    Special Requests   Final    NONE Performed at [facility], [place] [address] [place], [place] [address]    Culture   Final    NO GROWTH Performed at [facility], [place] [address] [place], [place] [address]    Report Status 09/28/2018 FINAL  Final  Urine Culture     Status: Abnormal   Collection Time: 09/29/18 12:26 PM  Result Value Ref Range Status   Specimen Description   Final    URINE, RANDOM Performed at [place] [address] [place], [place] [address]    Special Requests   Final    NONE Performed at [facility], [place] [address], [place], [place] [address]    Culture (A)  Final    <10,000 COLONIES/mL INSIGNIFICANT GROWTH Performed at [place] [address] [place],  [address]    Report Status 09/30/2018 FINAL  Final         Radiology Studies: US Renal  Result Date: 09/29/2018 CLINICAL DATA:  Acute kidney failure. EXAM: RENAL / URINARY TRACT ULTRASOUND COMPLETE COMPARISON:  None. FINDINGS: Right Kidney: Renal measurements: 10.5 x 3.3 x 5.1 cm = volume: 90.9 mL . Echogenicity within normal limits. No mass or hydronephrosis visualized. There is generalized cortical thinning. Left Kidney: Renal measurements: 10.5 x 4.4 x 4.4 cm = volume: 106.3 mL. Echogenicity within normal limits. No hydronephrosis visualized. There is a 1 x 0.7 x 0.7 cm simple  cyst in the upper pole left kidney. Bladder: Appears normal for degree of bladder distention. IMPRESSION: Generalized cortical thinning of the right kidney. No acute abnormalities identified in both kidneys. Electronically Signed  By: Abelardo Diesel M.D.   On: 09/29/2018 13:13        Scheduled Meds: . amphetamine-dextroamphetamine  30 mg Oral BID  . enoxaparin (LOVENOX) injection  30 mg Subcutaneous Q24H  . feeding supplement (ENSURE ENLIVE)  237 mL Oral BID BM  . hydrALAZINE  10 mg Oral BID  . lactulose  20 g Oral BID  . letrozole  2.5 mg Oral Daily  . levothyroxine  150 mcg Oral Q0600  . palbociclib  125 mg Oral Q breakfast  . PARoxetine  40 mg Oral Daily  . sodium bicarbonate  650 mg Oral Daily   Continuous Infusions: . sodium chloride 100 mL/hr at 10/01/18 0129  . cefTRIAXone (ROCEPHIN)  IV 1 g (10/01/18 1000)  . famotidine (PEPCID) IV    . fluconazole (DIFLUCAN) IV       LOS: 2 days    Time spent: 35 minutes.     Elmarie Shiley, MD [facility] Pager [phone]  If 7PM-7AM, please contact night-coverage [email or web address] Password [email or web address] 10/01/2018, 10:09 AM

## 2018-10-02 ENCOUNTER — Ambulatory Visit: Payer: Medicare Other | Admitting: Family Medicine

## 2018-10-02 ENCOUNTER — Inpatient Hospital Stay (HOSPITAL_COMMUNITY)
Admit: 2018-10-02 | Discharge: 2018-10-02 | Disposition: A | Discharge status: Home / Self Care | Payer: Medicare Other | Attending: Internal Medicine | Admitting: Internal Medicine

## 2018-10-02 LAB — BASIC METABOLIC PANEL
Anion gap: 10 (ref 5–15)
BUN: 16 mg/dL (ref 8–23)
CALCIUM: 9.3 mg/dL (ref 8.9–10.3)
CO2: 20 mmol/L — AB (ref 22–32)
CREATININE: 1.69 mg/dL — AB (ref 0.44–1.00)
Chloride: 110 mmol/L (ref 98–111)
GFR calc Af Amer: 35 mL/min — ABNORMAL LOW (ref >60)
GFR, EST NON AFRICAN AMERICAN: 30 mL/min — AB (ref >60)
GLUCOSE: 80 mg/dL (ref 70–99)
Potassium: 3.5 mmol/L (ref 3.5–5.1)
Sodium: 140 mmol/L (ref 135–145)

## 2018-10-02 LAB — HEPATIC FUNCTION PANEL
ALK PHOS: 281 U/L — AB (ref 38–126)
ALT: 47 U/L — ABNORMAL HIGH (ref 0–44)
AST: 127 U/L — ABNORMAL HIGH (ref 15–41)
Albumin: 2.4 g/dL — ABNORMAL LOW (ref 3.5–5.0)
BILIRUBIN DIRECT: 0.7 mg/dL — AB (ref 0.0–0.2)
BILIRUBIN INDIRECT: 0.8 mg/dL (ref 0.3–0.9)
BILIRUBIN TOTAL: 1.5 mg/dL — AB (ref 0.3–1.2)
Total Protein: 5.4 g/dL — ABNORMAL LOW (ref 6.5–8.1)

## 2018-10-02 LAB — CBC
HEMATOCRIT: 32.7 % — AB (ref 36.0–46.0)
Hemoglobin: 10 g/dL — ABNORMAL LOW (ref 12.0–15.0)
MCH: 30.4 pg (ref 26.0–34.0)
MCHC: 30.6 g/dL (ref 30.0–36.0)
MCV: 99.4 fL (ref 80.0–100.0)
NRBC: 0 % (ref 0.0–0.2)
PLATELETS: 175 10*3/uL (ref 150–400)
RBC: 3.29 MIL/uL — ABNORMAL LOW (ref 3.87–5.11)
RDW: 15 % (ref 11.5–15.5)
WBC: 4.6 10*3/uL (ref 4.0–10.5)

## 2018-10-02 LAB — AMMONIA: Ammonia: 26 umol/L (ref 9–35)

## 2018-10-02 MED ORDER — QUETIAPINE FUMARATE 25 MG PO TABS
25.0000 mg | ORAL_TABLET | Freq: Every day | ORAL | Status: DC | PRN
  Administered 2018-10-02: 25 mg via ORAL
  Filled 2018-10-02: qty 1

## 2018-10-02 MED ORDER — ENOXAPARIN SODIUM 40 MG/0.4ML ~~LOC~~ SOLN
40.0000 mg | SUBCUTANEOUS | Status: DC
  Administered 2018-10-04: 40 mg via SUBCUTANEOUS
  Filled 2018-10-02 (×2): qty 0.4

## 2018-10-02 MED ORDER — FAMOTIDINE 20 MG PO TABS
20.0000 mg | ORAL_TABLET | Freq: Every day | ORAL | Status: DC
  Administered 2018-10-03 – 2018-10-05 (×3): 20 mg via ORAL
  Filled 2018-10-02 (×3): qty 1

## 2018-10-02 NOTE — Procedures (Signed)
ELECTROENCEPHALOGRAM REPORT   Patient: Cheyenne Mckenzie       Ro1om #: [id number] EEG No. ID: [id number] Age: 67 y.o.        Sex: female Referring Physician: [id number] Report Date:  10/02/2018        Interpreting Physician: Alexis Goodell  History: [name] is an 67 y.o. female with altered mental status  Medications:  Adderall, Vibra-tabs, Pepcid, Diflucan, Apresoline, Chronulac, Femara, Synthroid, Paxil, Ibrance  Conditions of Recording:  This is a 21 channel routine scalp EEG performed with bipolar and monopolar montages arranged in accordance to the international 10/20 system of electrode placement. One channel was dedicated to EKG recording.  The patient is in the awake state.  Description:  Artifact is prominent during the recording often obscuring the background rhythm. When able to be visualized the background is slow and poorly organized.   It consists of a low voltage, polymorphic delta rhythm that is diffusely distributed and continuous.  There is intermixed theta activity noted at times as well.   The patient does not drowse or sleep. No epileptiform activity is noted. Hyperventilation and intermittent photic stimulation were not performed.  IMPRESSION: This is an abnormal EEG secondary to general background slowing.  This finding may be seen with a diffuse disturbance that is etiologically nonspecific, but may include a metabolic encephalopathy, among other possibilities.  No epileptiform activity was noted.     Alexis Goodell, MD Neurology [phone] 10/02/2018, 4:36 PM

## 2018-10-02 NOTE — Progress Notes (Signed)
   10/02/18 1400  Clinical Encounter Type  Visited With Patient;Other (Comment) Freight forwarder)  Visit Type Other (Comment);Initial (AD)  Referral From Physician  Consult/Referral To Chaplain  The chaplain responded to spiritual care consult for an AD.  Before entering the Pt. room, the chaplain consulted with Pt. RN and PA.  The healthcare team described the Pt. as lethargic. The Pt. has a sitter present in the room.  The chaplain greeted the Pt. as she entered the room with no response from the Pt.  The chaplain witnessed the  Pt. sporadic, open eyed responses to the sitter's efforts to replace her socks and blankets.  At this time chaplain was unable to begin conversation about an AD.  The sitter informed the chaplain, family members may be returning this afternoon.  The sitter will communicate with the RN and chaplain when the Pt. guests arrive.

## 2018-10-02 NOTE — Progress Notes (Signed)
EEG completed; results pending.    

## 2018-10-02 NOTE — Progress Notes (Addendum)
PROGRESS NOTE    Cheyenne Mckenzie  FIE:[id number] DOB: 18-Oct-1951 DOA: 09/28/2018 PCP: Stephens Shire, MD    Brief Narrative: [name] is a 67 y.o. female with medical history significant of anxiety, ADD, depression, HTN, CKD 3 presenting to the hospital for evaluation of altered mental status.  Per sister and roommate at bedside patient has been confused since her previous hospitalization.  States her appetite is okay but she she has not been consuming enough fluid due to her confusion.  She has been sleeping all the time and having generalized weakness.  Per roommate, when patient speaks it sounds like a "word salad."  She has not had any fevers, chills, nausea, vomiting, or diarrhea.  They believe her urine appears dark.  States Dr. Arnoldo Lenis office advised them to bring the patient into the hospital due to abnormal labs.  Patient has not started taking her cancer pain medications yet.  Family thinks her mental status has improved since she has received IV fluids in the ED.  Patient was recently admitted from September 19, 2018 to September 22, 2018.  CT chest revealed a right-sided breast mass which was thought to be likely breast cancer with liver mets, status post liver biopsy.  Patient was also encephalopathic and MRI brain did not show any acute findings.  Ammonia was only mildly elevated at 41.  Liver biopsy revealed metastatic carcinoma.  Patient was seen by Dr. Sonny Dandy on September 27, 2018.  ED Course: Afebrile and remainder of vitals stable on arrival.  CBC normal.  Creatinine 2.5, was 2.1 two days ago.  Corrected calcium 12.1; was 12.5 two days ago.  AST 140, ALT 48, alk phos 324, and T bili 1.0.  LFTs improved compared to labs done 2 days ago.  Ammonia mildly elevated at 38.  UA pending.  I-STAT troponin negative and EKG not suggestive of ACS.  Chest x-ray showing mild cardiomegaly and trace pleural effusions.  CT head showing no acute abnormality or evidence of metastatic disease to the  brain. TRH paged to admit.     Assessment & Plan:   Principal Problem:   Acute metabolic encephalopathy Active Problems:   ADD (attention deficit disorder)   Depression with anxiety   Hypothyroidism   Acute renal failure superimposed on stage 3 chronic kidney disease (HCC)   Hypercalcemia   Essential hypertension   Elevated liver function tests   Metastatic breast cancer (HCC)   Pressure injury of skin   1-Acute on chronic renal failure stage III;  Prior cr 1.4.  Presents with cr at 2.1. --1.9--1.8--1.6 Could be related to hypovolemia.  Continue with IV fluid.  Renal US; no hydronephrosis.  She is incontinent. Urine out put not accurate.  Improving. Continue with IV fluids.  Started  sodium bicarb.   2-Acute metabolic encephalopathy; component of hepatic encephalopathy;  Related to dehydration, vs component of hepatic encephalopathy  Ammonia mildly elevated.  Continue with IV fluids.  Lactulose BID>  CT head negative.  Treat for infection; UA with 6-10 WBC. Follow urine culture. No significant growth. Will discontinue ceftriaxone.  B 12 elevated, TSH normal;.  Unclear etiology, Neurology consulted.   Hypercalcemia; malignancy  continue with IV fluid.  Calcium trending down,   Transaminases; Secondary to metastasis diseases to liver.   Right side metastatic  Breast cancer, metastasis to liver,  Follow with Dr Lindi Adie, added to rounding list.  Started on Ibrace abd letrozole.  Breast mass, open wound with mild amount of drainage. Started  doxy.   Anxiety, depression -Continue  Paxil -resume benzodiazepine  Added seroquel to help her sleep at night  ADD -Continue home Adderall  Hypothyroidism Patient is currently on Synthroid 137 mcg daily.  TSH checked on October 30 was mildly elevated at 4.85; no dose change.  -Dose Synthroid to 150 mcg daily.    Mild dyspnea; check Chest x ray.   Odynophagia; start diflucan   HTN -Currently not on any home  medications. -IV hydralazine 5 mg every 4 hours as needed for SBP >160 Schedule hydralazine.   RN Pressure Injury Documentation:  Malnutrition Type:      Malnutrition Characteristics:      Nutrition Interventions:     Estimated body mass index is 21.24 kg/m as calculated from the following:   Height as of this encounter: 5' 8"  (1.727 m).   Weight as of this encounter: 63.4 kg.   DVT prophylaxis: lovenox Code Status: DNR, Sister at bedside, she understand that Cheyenne. Mckenzie is sick and has metastatic cancer. She relates that Cheyenne Mishra wouldn't want to be on life support, no CPR, no Shock, No medications. Patient's friends who has live with patient for long period of time express the same. Patient can not make medical decision, next of kin is sister.   Family Communication: no family at bedside.  Disposition Plan: remain in the hospital for IV fluids not adequate oral intake . Monitor renal function. PT evaluation    Consultants:   Dr Lindi Adie , added to rounding team.    Procedures:  Renal US. Generalized cortical thinning of the right kidney. No acute  abnormalities identified in both kidneys.   Antimicrobials:   None    Subjective: Sleepy this am, per report didn't sleep .  Confuse.   Objective: Vitals:   10/02/18 0428 10/02/18 0636 10/02/18 1229 10/02/18 1245  BP: (!) 177/108 131/86 (!) 158/103   Pulse: 91 92 91   Resp: 18  18   Temp: 97.9 F (36.6 C)  98.4 F (36.9 C)   TempSrc: Axillary  Oral   SpO2: 100%  97% 97%  Weight:      Height:        Intake/Output Summary (Last 24 hours) at 10/02/2018 1554 Last data filed at 10/02/2018 0520 Gross per 24 hour  Intake 1672.21 ml  Output -  Net 1672.21 ml   Filed Weights   09/28/18 1910  Weight: 63.4 kg    Examination:  General exam: NAD Respiratory system: CTA Cardiovascular system: S 1, S  2RRR Gastrointestinal system: BS present, soft, nt Central nervous system: sleepy, confuse.    Extremities: Symmetric power.  Skin: right breast with mass, open wound, draining     Data Reviewed: I have personally reviewed following labs and imaging studies  CBC: Recent Labs  Lab 09/27/18 1335 09/28/18 2022 09/29/18 1040 09/30/18 0355 10/01/18 0452 10/02/18 0500  WBC 4.5 4.6 5.7 4.9 6.1 4.6  NEUTROABS 3.2 3.1  --   --   --   --   HGB 13.4 12.9 12.6 11.0* 11.5* 10.0*  HCT 43.1 42.7 41.3 35.7* 37.4 32.7*  MCV 98.6 99.3 101.5* 99.4 100.0 99.4  PLT 177 199 166 160 203 130   Basic Metabolic Panel: Recent Labs  Lab 09/28/18 2022 09/29/18 1040 09/30/18 0355 10/01/18 0452 10/02/18 0500  NA 139 137 133* 135 140  K 4.5 4.1 3.7 3.9 3.5  CL 105 105 105 105 110  CO2 23 22 18* 21* 20*  GLUCOSE 88 97 95  92 80  BUN 32* 31* 25* 20 16  CREATININE 2.50* 2.31* 1.98* 1.86* 1.69*  CALCIUM 11.2* 10.8* 9.6 9.5 9.3   GFR: Estimated Creatinine Clearance: 31.4 mL/min (A) (by C-G formula based on SCr of 1.69 mg/dL (H)). Liver Function Tests: Recent Labs  Lab 09/27/18 1335 09/28/18 2022 09/29/18 0620 09/30/18 0355 10/02/18 1100  AST 160* 140* 134* 135* 127*  ALT 54* 48* 47* 46* 47*  ALKPHOS 406* 324* 340* 330* 281*  BILITOT 1.1 1.0 0.8 1.2 1.5*  PROT 6.5 6.7 5.7* 5.9* 5.4*  ALBUMIN 2.5* 2.9* 2.5* 2.6* 2.4*   No results for input(s): LIPASE, AMYLASE in the last 168 hours. Recent Labs  Lab 09/27/18 1334 09/28/18 2023 09/30/18 0355 10/02/18 1100  AMMONIA 45* 38* 38* 26   Coagulation Profile: No results for input(s): INR, PROTIME in the last 168 hours. Cardiac Enzymes: No results for input(s): CKTOTAL, CKMB, CKMBINDEX, TROPONINI in the last 168 hours. BNP (last 3 results) No results for input(s): PROBNP in the last 8760 hours. HbA1C: No results for input(s): HGBA1C in the last 72 hours. CBG: No results for input(s): GLUCAP in the last 168 hours. Lipid Profile: No results for input(s): CHOL, HDL, LDLCALC, TRIG, CHOLHDL, LDLDIRECT in the last 72 hours. Thyroid  Function Tests: Recent Labs    09/30/18 1030  TSH 3.548   Anemia Panel: No results for input(s): VITAMINB12, FOLATE, FERRITIN, TIBC, IRON, RETICCTPCT in the last 72 hours. Sepsis Labs: No results for input(s): PROCALCITON, LATICACIDVEN in the last 168 hours.  Recent Results (from the past 240 hour(s))  Urine Culture     Status: None   Collection Time: 09/27/18  1:14 PM  Result Value Ref Range Status   Specimen Description   Final    URINE, CLEAN CATCH Performed at [facility], [address], [place], [place] [address]    Special Requests   Final    NONE Performed at [facility], [place] [address] [place], [place] [address]    Culture   Final    NO GROWTH Performed at [facility], [place] [address], [place], [place] [address]    Report Status 09/28/2018 FINAL  Final  Urine Culture     Status: Abnormal   Collection Time: 09/29/18 12:26 PM  Result Value Ref Range Status   Specimen Description   Final    URINE, RANDOM Performed at [place] [address] [place], [place] [address]    Special Requests   Final    NONE Performed at [facility], [place] [address] [place], [place] [address]    Culture (A)  Final    <10,000 COLONIES/mL INSIGNIFICANT GROWTH Performed at [place] [address], [place],  [address]    Report Status 09/30/2018 FINAL  Final         Radiology Studies: Dg Chest 2 View  Result Date: 10/01/2018 CLINICAL DATA:  Dyspnea. EXAM: CHEST - 2 VIEW COMPARISON:  Radiographs of September 28, 2018. FINDINGS: Stable cardiomediastinal silhouette. No pneumothorax or pleural effusion is noted. Both lungs are clear. The visualized skeletal structures are unremarkable. IMPRESSION: No active cardiopulmonary disease. Electronically Signed   By: Marijo Conception, M.D.   On: 10/01/2018 21:05        Scheduled Meds: . amphetamine-dextroamphetamine  30  mg Oral BID  . doxycycline  100 mg Oral Q12H  . [START ON 10/03/2018] enoxaparin (LOVENOX) injection  40 mg Subcutaneous Q24H  . [  START ON 10/03/2018] famotidine  20 mg Oral Daily  . feeding supplement (ENSURE ENLIVE)  237 mL Oral BID BM  . hydrALAZINE  25 mg Oral BID  . lactulose  20 g Oral BID  . letrozole  2.5 mg Oral Daily  . levothyroxine  150 mcg Oral Q0600  . palbociclib  125 mg Oral Q breakfast  . PARoxetine  40 mg Oral Daily  . sodium bicarbonate  650 mg Oral Daily   Continuous Infusions: . sodium chloride 100 mL/hr at 10/02/18 0204  . fluconazole (DIFLUCAN) IV 100 mg (10/02/18 1054)     LOS: 3 days    Time spent: 35 minutes.     Elmarie Shiley, MD [facility] Pager [phone]  If 7PM-7AM, please contact night-coverage [email or web address] Password [facility] 10/02/2018, 3:54 PM

## 2018-10-02 NOTE — Consult Note (Signed)
[place] Nurse wound consult note Reason for Consult:Wound care suggestions for draining right breast lesion. Wound type: neoplastic Pressure Injury POA: NA Measurement: 2.5cm x 3.5cm x 0.2cm with scattered areas of wound obscured by yellow slough. Otherwise pink, moist tissue Wound bed:As described above Drainage (amount, consistency, odor) small to moderate amount of light yellow serous exudate Periwound: intact Dressing procedure/placement/frequency: I will implement a POC directed at exudate absorption and donation of antimicrobial property for odor control and enhancement of healing/closure using silver hydrofiber dressings topped with silicone foam adhesives for their gentle securement. [place] nursing team will not follow, but will remain available to this patient, the nursing and medical teams.  Please re-consult if needed. Thanks, Maudie Flakes, MSN, RN, [place], Arther Abbott  Pager# [phone]

## 2018-10-02 NOTE — Consult Note (Addendum)
Neurology Consultation  Reason for Consult: Confusion Referring Physician: REGALADO,  CC: Confusion  History is obtained from: Chart  HPI: Cheyenne Mckenzie is a 67 y.o. female with medical history significant for anxiety, ADD, depression, hypertension, chronic kidney disease who presented to [facility] for evaluation of altered mental status.  Per chart, sister and roommate have stated that she has been confused since her previous hospitalization.  Patient was discharged previously on 09/22/2018.  Patient then came back to the hospital on 09/28/2018.  Per chart she had been sleeping all day and having generalized weakness.  She had an okay appetite but not drinking much.  Since patient has been in the hospital she received IV fluids and family felt that she had improved in her cognition.  Patient was recently found to have right-sided breast mass which metastasized to her liver.   Ammonia was only slightly elevated however her AST and ALT have been elevated especially her AST being 140.  Of note  IBRANCE cause Fatigue in 37-41%-Per up-to-date    Femara cause Fatigue in 10-13%.,-Per up-to-date   ROS: Unable to obtain due to altered mental status.   Past Medical History:  Diagnosis Date  . ADD (attention deficit disorder)   . Anxiety   . Depression   . Essential hypertension   . RLS (restless legs syndrome)   . Thyroid disease   . Tobacco abuse    Family History  Problem Relation Age of Onset  . Cancer Mother        Mother died of cancer, not know which type of cancer  . Cancer Father        Father died of cancer, not know which type of cancer.   Social History:   reports that she has been smoking. She has never used smokeless tobacco. She reports that she drank alcohol. She reports that she does not use drugs.  Medications  Current Facility-Administered Medications:  .  0.9 %  sodium chloride infusion, , Intravenous, Continuous, Regalado, Belkys A, MD, Last Rate: 100 mL/hr  at 10/02/18 0204 .  ALPRAZolam Duanne Moron) tablet 0.5 mg, 0.5 mg, Oral, BID PRN, Regalado, Belkys A, MD, 0.5 mg at 10/01/18 2122 .  amphetamine-dextroamphetamine (ADDERALL) tablet 30 mg, 30 mg, Oral, BID, Shela Leff, MD, 30 mg at 10/01/18 2122 .  antiseptic oral rinse (BIOTENE) solution 15 mL, 15 mL, Mouth Rinse, PRN, Regalado, Belkys A, MD .  doxycycline (VIBRA-TABS) tablet 100 mg, 100 mg, Oral, Q12H, Regalado, Belkys A, MD, 100 mg at 10/01/18 2123 .  enoxaparin (LOVENOX) injection 30 mg, 30 mg, Subcutaneous, Q24H, Shela Leff, MD, 30 mg at 10/01/18 1014 .  famotidine (PEPCID) IVPB 20 mg premix, 20 mg, Intravenous, Daily, Regalado, Belkys A, MD, Last Rate: 100 mL/hr at 10/02/18 1003, 20 mg at 10/02/18 1003 .  feeding supplement (ENSURE ENLIVE) (ENSURE ENLIVE) liquid 237 mL, 237 mL, Oral, BID BM, Regalado, Belkys A, MD, 237 mL at 10/01/18 1654 .  fluconazole (DIFLUCAN) IVPB 100 mg, 100 mg, Intravenous, Q24H, Regalado, Belkys A, MD, Last Rate: 50 mL/hr at 10/02/18 1054, 100 mg at 10/02/18 1054 .  hydrALAZINE (APRESOLINE) injection 5 mg, 5 mg, Intravenous, Q4H PRN, Shela Leff, MD, 5 mg at 10/02/18 0515 .  hydrALAZINE (APRESOLINE) tablet 25 mg, 25 mg, Oral, BID, Regalado, Belkys A, MD, 25 mg at 10/01/18 2123 .  lactulose (CHRONULAC) 10 GM/15ML solution 20 g, 20 g, Oral, BID, Regalado, Belkys A, MD, 20 g at 10/01/18 2123 .  letrozole [facility]) tablet  2.5 mg, 2.5 mg, Oral, Daily, Shela Leff, MD, 2.5 mg at 10/01/18 1014 .  levothyroxine (SYNTHROID, LEVOTHROID) tablet 150 mcg, 150 mcg, Oral, Q0600, Shela Leff, MD, 150 mcg at 10/02/18 0518 .  palbociclib (IBRANCE) capsule 125 mg, 125 mg, Oral, Q breakfast, Shela Leff, MD, 125 mg at 10/01/18 1001 .  PARoxetine (PAXIL) tablet 40 mg, 40 mg, Oral, Daily, Shela Leff, MD, 40 mg at 10/01/18 1013 .  QUEtiapine (SEROQUEL) tablet 25 mg, 25 mg, Oral, Daily PRN, Regalado, Belkys A, MD .  senna-docusate (Senokot-S)  tablet 1 tablet, 1 tablet, Oral, QHS PRN, Shela Leff, MD .  sodium bicarbonate tablet 650 mg, 650 mg, Oral, Daily, Regalado, Belkys A, MD, 650 mg at 10/01/18 1013 .  traMADol (ULTRAM) tablet 50 mg, 50 mg, Oral, Q12H PRN, Regalado, Belkys A, MD, 50 mg at 10/01/18 2123   Exam: Current vital signs: BP 131/86 Comment: nurse notified   Pulse 92   Temp 97.9 F (36.6 C) (Axillary)   Resp 18   Ht 5\' 8"  (1.727 m)   Wt 63.4 kg   SpO2 100%   BMI 21.24 kg/m  Vital signs in last 24 hours: Temp:  [97.9 F (36.6 C)-98.6 F (37 C)] 97.9 F (36.6 C) (11/11 0428) Pulse Rate:  [87-97] 92 (11/11 0636) Resp:  [18-20] 18 (11/11 0428) BP: (131-177)/(86-110) 131/86 (11/11 0636) SpO2:  [100 %] 100 % (11/11 0428)  Physical Exam  Constitutional: Appears well-developed and well-nourished.  Psych: Affect appropriate to situation Eyes: No scleral injection HENT: No OP obstrucion Head: Normocephalic.  Cardiovascular: Normal rate and regular rhythm.  Respiratory: Effort normal, non-labored breathing GI: Soft.  No distension. There is no tenderness.  Skin: Shows multiple ecchymotic areas  Neuro: Mental Status: Patient is extremely lethargic, if not stimulated will fall back asleep, does know that she is in [place] however when asked what state or year she perseverates on [place].  She has difficulty following simple commands such as raising her hands, counting fingers, but a lot of this is due to her lethargy. Cranial Nerves: II: Blinks to threat bilaterally. III,IV, VI: EOMI without ptosis or diploplia.  Pupils are equal, round, and reactive to light--5 mm restricted to 2 mm V: Facial sensation is symmetric to temperature VII: Facial movement is symmetric.  VIII: hearing is intact to voice X: Uvula elevates symmetrically XI: Shoulder shrug is symmetric. XII: Midline and extremely dry.  Motor: Tone is normal however she is weak with 4/5 strength, it is noticed that she has a  postural tremor along with asterixis Sensory: Sensation is symmetric  Deep Tendon Reflexes: 2+ and symmetric in the biceps and patellae.  Plantars: Toes are downgoing bilaterally.  Cerebellar: I could not get her to do finger-nose or heel-to-shin secondary to her inability to understand what I was asking and to to her lethargy  Labs I have reviewed labs in epic and the results pertinent to this consultation are:   CBC    Component Value Date/Time   WBC 4.6 10/02/2018 0500   RBC 3.29 (L) 10/02/2018 0500   HGB 10.0 (L) 10/02/2018 0500   HGB 13.4 09/27/2018 1335   HCT 32.7 (L) 10/02/2018 0500   PLT 175 10/02/2018 0500   PLT 177 09/27/2018 1335   MCV 99.4 10/02/2018 0500   MCH 30.4 10/02/2018 0500   MCHC 30.6 10/02/2018 0500   RDW 15.0 10/02/2018 0500   LYMPHSABS 0.9 09/28/2018 2022   MONOABS 0.5 09/28/2018 2022   EOSABS 0.1 09/28/2018  2022   BASOSABS 0.1 09/28/2018 2022    CMP     Component Value Date/Time   NA 140 10/02/2018 0500   K 3.5 10/02/2018 0500   CL 110 10/02/2018 0500   CO2 20 (L) 10/02/2018 0500   GLUCOSE 80 10/02/2018 0500   BUN 16 10/02/2018 0500   CREATININE 1.69 (H) 10/02/2018 0500   CREATININE 2.11 (H) 09/27/2018 1335   CALCIUM 9.3 10/02/2018 0500   PROT 5.4 (L) 10/02/2018 1100   ALBUMIN 2.4 (L) 10/02/2018 1100   AST 127 (H) 10/02/2018 1100   AST 160 (H) 09/27/2018 1335   ALT 47 (H) 10/02/2018 1100   ALT 54 (H) 09/27/2018 1335   ALKPHOS 281 (H) 10/02/2018 1100   BILITOT 1.5 (H) 10/02/2018 1100   BILITOT 1.1 09/27/2018 1335   GFRNONAA 30 (L) 10/02/2018 0500   GFRNONAA 23 (L) 09/27/2018 1335   GFRAA 35 (L) 10/02/2018 0500   GFRAA 26 (L) 09/27/2018 1335    Imaging I have reviewed the images obtained:  MRI examination of the brain 09/20/18--no acute intracranial processes or metastasis however it is very motion degraded  Etta Quill PA-C [facility] [phone]  M-F  (9:00 am- 5:00 PM)  10/02/2018, 11:45 AM   Attending  addendum I seen and examined the patient Agree with the history and physical documented above.   Assessment:  67 year old female with altered mental status status post previous hospitalization.  Currently she is extremely lethargic, falls asleep easily and has difficulty following commands.  I do believe a lot of this can be a component of metabolic encephalopathy along with failure to thrive.   Impression: -Failure to thrive -Metabolic encephalopathy  Recommendations:  -When renal function improves, obtain MRI brain with contrast - EEG -Would recommend palliative care consult - Get oncology input regarding medication and medication side effects. We will follow with you  -- Amie Portland, MD [facility] Pager: [phone] If 7pm to 7am, please call on call as listed on AMION.

## 2018-10-02 NOTE — Progress Notes (Addendum)
Chaplain consulted with Pt's family, MD Regaldo around Health care power of attorney, code status   Pt is confused and not able to complete HCPOA paperwork at this time.   During this period,  Pt's next of kin and HCPOA is:   [place]  [phone]  [name] states pt has expressed wish not to be resuscitated if she is in a situation that is not recoverable.  [name] understands from Dr Lindi Adie that her current situation is not one where she would wish resuscitation.    Family spoke with Dr. Frederic Jericho about change in code status to DNR.     Will consult with case management re: access to meds at outside pharmacy [facility].  [name] reports [facility] will not allow her to pick up pt's chemo med without HCPOA.  We may be able to provide documentation or assist in petition for temporary guardianship so [name] can assist pt with medication.     Jerene Pitch, MDiv, [facility] Lead Clinical Chaplain  [place], [place]  24 hour page [phone]

## 2018-10-03 ENCOUNTER — Inpatient Hospital Stay (HOSPITAL_COMMUNITY): Payer: Medicare Other

## 2018-10-03 DIAGNOSIS — C799 Secondary malignant neoplasm of unspecified site: Secondary | ICD-10-CM

## 2018-10-03 DIAGNOSIS — R609 Edema, unspecified: Secondary | ICD-10-CM

## 2018-10-03 DIAGNOSIS — G9341 Metabolic encephalopathy: Secondary | ICD-10-CM

## 2018-10-03 DIAGNOSIS — C50919 Malignant neoplasm of unspecified site of unspecified female breast: Secondary | ICD-10-CM

## 2018-10-03 LAB — BASIC METABOLIC PANEL
Anion gap: 7 (ref 5–15)
BUN: 19 mg/dL (ref 8–23)
CO2: 19 mmol/L — ABNORMAL LOW (ref 22–32)
Calcium: 9.2 mg/dL (ref 8.9–10.3)
Chloride: 114 mmol/L — ABNORMAL HIGH (ref 98–111)
Creatinine, Ser: 1.64 mg/dL — ABNORMAL HIGH (ref 0.44–1.00)
GFR calc Af Amer: 36 mL/min — ABNORMAL LOW (ref >60)
GFR, EST NON AFRICAN AMERICAN: 31 mL/min — AB (ref >60)
Glucose, Bld: 83 mg/dL (ref 70–99)
POTASSIUM: 3.7 mmol/L (ref 3.5–5.1)
SODIUM: 140 mmol/L (ref 135–145)

## 2018-10-03 LAB — CBC
HCT: 32.4 % — ABNORMAL LOW (ref 36.0–46.0)
Hemoglobin: 9.9 g/dL — ABNORMAL LOW (ref 12.0–15.0)
MCH: 31.1 pg (ref 26.0–34.0)
MCHC: 30.6 g/dL (ref 30.0–36.0)
MCV: 101.9 fL — AB (ref 80.0–100.0)
PLATELETS: 167 10*3/uL (ref 150–400)
RBC: 3.18 MIL/uL — ABNORMAL LOW (ref 3.87–5.11)
RDW: 15.3 % (ref 11.5–15.5)
WBC: 4.5 10*3/uL (ref 4.0–10.5)
nRBC: 0 % (ref 0.0–0.2)

## 2018-10-03 MED ORDER — HYDRALAZINE HCL 50 MG PO TABS
50.0000 mg | ORAL_TABLET | Freq: Three times a day (TID) | ORAL | Status: DC
  Administered 2018-10-03 – 2018-10-05 (×5): 50 mg via ORAL
  Filled 2018-10-03 (×6): qty 1

## 2018-10-03 MED ORDER — HYDRALAZINE HCL 25 MG PO TABS
25.0000 mg | ORAL_TABLET | Freq: Three times a day (TID) | ORAL | Status: DC
  Administered 2018-10-03: 25 mg via ORAL
  Filled 2018-10-03: qty 1

## 2018-10-03 NOTE — Progress Notes (Signed)
Chart review note EEG negative for seizures. Pt continues to be encephalopathic. In a critically sick patient with metastatic cancer, minor metabolic derangements can cause exacerbated symptoms and she has multiple derangements including those of renal function and hepatic function, in addition to medications that can cause sedation/fatigue and ashtenia.  A: Toxic metabolic encephalopathy in a patient with metastatic breast cancer whos is currently on chemotherapy Failure to thrive secondary to metastatic malignancy  Recs: It is my recommendation that oncology input regarding prognosis should be procured at this time. Palliative consult if deemed appropriate by Oncology. If the oncological prognosis is good, and further work up for encephalopathy is desired, next steps would be the following: 1. Correction of toxic metabolic derangements 2. Repeat MRI brain, with and without contrast to look for mets and meningeal spread - when GFR normalizes 3. LP - especially cytology to look for meningeal carcinomatosis.  Discussed plan in depth with Dr. Tyrell Antonio over the phone.  Neurology service will be available as needed. Please call with questions.  -- Amie Portland, MD [facility] Pager: [phone] If 7pm to 7am, please call on call as listed on AMION.

## 2018-10-03 NOTE — Progress Notes (Addendum)
PROGRESS NOTE    Cheyenne Mckenzie  CBU:[id number] DOB: March 13, 1951 DOA: 09/28/2018 PCP: Stephens Shire, MD    Brief Narrative: Cheyenne Mckenzie is a 67 y.o. female with medical history significant of anxiety, ADD, depression, HTN, CKD 3 presenting to the hospital for evaluation of altered mental status.  Per sister and roommate at bedside patient has been confused since her previous hospitalization.  States her appetite is okay but she she has not been consuming enough fluid due to her confusion.  She has been sleeping all the time and having generalized weakness.  Per roommate, when patient speaks it sounds like a "word salad."  She has not had any fevers, chills, nausea, vomiting, or diarrhea.  They believe her urine appears dark.  States Dr. Arnoldo Lenis office advised them to bring the patient into the hospital due to abnormal labs.  Patient has not started taking her cancer pain medications yet.  Family thinks her mental status has improved since she has received IV fluids in the ED.  Patient was recently admitted from September 19, 2018 to September 22, 2018.  CT chest revealed a right-sided breast mass which was thought to be likely breast cancer with liver mets, status post liver biopsy.  Patient was also encephalopathic and MRI brain did not show any acute findings.  Ammonia was only mildly elevated at 41.  Liver biopsy revealed metastatic carcinoma.  Patient was seen by Dr. Sonny Dandy on September 27, 2018.  ED Course: Afebrile and remainder of vitals stable on arrival.  CBC normal.  Creatinine 2.5, was 2.1 two days ago.  Corrected calcium 12.1; was 12.5 two days ago.  AST 140, ALT 48, alk phos 324, and T bili 1.0.  LFTs improved compared to labs done 2 days ago.  Ammonia mildly elevated at 38.  UA pending.  I-STAT troponin negative and EKG not suggestive of ACS.  Chest x-ray showing mild cardiomegaly and trace pleural effusions.  CT head showing no acute abnormality or evidence of metastatic disease to the  brain. TRH paged to admit.   Patient admitted for worsening confusion, poor oral intake, AKI on chronic. She was treated with IV fluids. Infection work up unrevealing. EEG negative for seizure. Ammonia level not significantly elevated. Neurology consulted. Recommend MRI brain with contrast when GFR improved. Might need LP also depending on MRI results. Dr Lindi Adie will follow up on patient today.    Assessment & Plan:   Principal Problem:   Acute metabolic encephalopathy Active Problems:   ADD (attention deficit disorder)   Depression with anxiety   Hypothyroidism   Acute renal failure superimposed on stage 3 chronic kidney disease (HCC)   Hypercalcemia   Essential hypertension   Elevated liver function tests   Metastatic breast cancer (HCC)   Pressure injury of skin   1-Acute on chronic renal failure stage III;  Prior cr 1.4.  Presents with cr at 2.1. --1.9--1.8--1.6--1.6 Could be related to hypovolemia.  Continue with IV fluid.  Renal US; no hydronephrosis.  She is incontinent. Urine out put not accurate.  Improving. Continue with IV fluids.  Started  sodium bicarb.   2-Acute metabolic and or Toxic encephalopathy;  Related to dehydration, vs component of hepatic encephalopathy  Ammonia mildly elevated.  Continue with IV fluids.  Lactulose BID>  CT head negative.  Treat for infection; UA with 6-10 WBC. Follow urine culture. No significant growth. Will discontinue ceftriaxone.  B 12 elevated, TSH normal;.  Unclear etiology, Neurology consulted.  Will need MRI  with Contrast when GFR improved. Might also need LP. Will follow Dr Lindi Adie recommendations.   Hypercalcemia; malignancy  continue with IV fluid.  Calcium trending down,   Transaminases; Secondary to metastasis diseases to liver.   Right side metastatic  Breast cancer, metastasis to liver,  Follow with Dr Lindi Adie, added to rounding list.  Started on Ibrace abd letrozole. Will Follow Dr Lindi Adie recommendation,  regarding if we should continue with Ibrace while patient is on Doxy. Breast mass, open wound with mild amount of drainage. Started  doxy.   Anxiety, depression -Continue  Paxil -resume benzodiazepine  Added seroquel to help her sleep at night  ADD -Continue home Adderall  Hypothyroidism Patient is currently on Synthroid 137 mcg daily.  TSH checked on October 30 was mildly elevated at 4.85; no dose change.  -Dose Synthroid to 150 mcg daily.    Mild dyspnea; chest x ray negative  Odynophagia; started  diflucan   HTN -Currently not on any home medications. -IV hydralazine 5 mg every 4 hours as needed for SBP >160 Schedule hydralazine.  increase oral hydralazine to 50 mg TID>   Left arm hematoma; doppler negative for DVT   RN Pressure Injury Documentation:  Malnutrition Type:      Malnutrition Characteristics:      Nutrition Interventions:     Estimated body mass index is 21.24 kg/m as calculated from the following:   Height as of this encounter: _0  (1.727 m).   Weight as of this encounter: 63.4 kg.   DVT prophylaxis: lovenox Code Status: DNR, after discussion with sister.   Family Communication: Sister.  Disposition Plan: remain in the hospital for IV fluids not adequate oral intake . Monitor renal function. PT evaluation    Consultants:   Dr Lindi Adie  Neurology    Procedures:  Renal US. Generalized cortical thinning of the right kidney. No acute  abnormalities identified in both kidneys.   Antimicrobials:   None    Subjective: Sleepy, was refusing medication earlier per nurse.  Wake up , say few words.   Objective: Vitals:   10/02/18 1245 10/02/18 1926 10/02/18 2118 10/03/18 0640  BP:  (!) 151/97 (!) 173/104 (!) 167/108  Pulse:  86 90 86  Resp:  _1 Temp:  98.4 F (36.9 C) 98.1 F (36.7 C) 98 F (36.7 C)  TempSrc:  Axillary Axillary   SpO2: 97% 98% 94% 97%  Weight:      Height:        Intake/Output Summary (Last 24  hours) at 10/03/2018 0854 Last data filed at 10/02/2018 1700 Gross per 24 hour  Intake 622.42 ml  Output -  Net 622.42 ml   Filed Weights   09/28/18 1910  Weight: 63.4 kg    Examination:  General exam: NAD, sleepy  Respiratory system: CTA Cardiovascular system: S 1, S 2 RRR Gastrointestinal system: BS present, soft, nt Central nervous system: Sleepy, confuse Extremities: Symmetric power.   Skin: right breast with mass, open wound, draining     Data Reviewed: I have personally reviewed following labs and imaging studies  CBC: Recent Labs  Lab 09/27/18 1335 09/28/18 2022 09/29/18 1040 09/30/18 0355 10/01/18 0452 10/02/18 0500  WBC 4.5 4.6 5.7 4.9 6.1 4.6  NEUTROABS 3.2 3.1  --   --   --   --   HGB 13.4 12.9 12.6 11.0* 11.5* 10.0*  HCT 43.1 42.7 41.3 35.7* 37.4 32.7*  MCV 98.6 99.3 101.5* 99.4 100.0 99.4  PLT 177 199  166 160 203 263   Basic Metabolic Panel: Recent Labs  Lab 09/28/18 2022 09/29/18 1040 09/30/18 0355 10/01/18 0452 10/02/18 0500  NA 139 137 133* 135 140  K 4.5 4.1 3.7 3.9 3.5  CL 105 105 105 105 110  CO2 23 22 18* 21* 20*  GLUCOSE 88 97 95 92 80  BUN 32* 31* 25* 20 16  CREATININE 2.50* 2.31* 1.98* 1.86* 1.69*  CALCIUM 11.2* 10.8* 9.6 9.5 9.3   GFR: Estimated Creatinine Clearance: 31.4 mL/min (A) (by C-G formula based on SCr of 1.69 mg/dL (H)). Liver Function Tests: Recent Labs  Lab 09/27/18 1335 09/28/18 2022 09/29/18 0620 09/30/18 0355 10/02/18 1100  AST 160* 140* 134* 135* 127*  ALT 54* 48* 47* 46* 47*  ALKPHOS 406* 324* 340* 330* 281*  BILITOT 1.1 1.0 0.8 1.2 1.5*  PROT 6.5 6.7 5.7* 5.9* 5.4*  ALBUMIN 2.5* 2.9* 2.5* 2.6* 2.4*   No results for input(s): LIPASE, AMYLASE in the last 168 hours. Recent Labs  Lab 09/27/18 1334 09/28/18 2023 09/30/18 0355 10/02/18 1100  AMMONIA 45* 38* 38* 26   Coagulation Profile: No results for input(s): INR, PROTIME in the last 168 hours. Cardiac Enzymes: No results for input(s):  CKTOTAL, CKMB, CKMBINDEX, TROPONINI in the last 168 hours. BNP (last 3 results) No results for input(s): PROBNP in the last 8760 hours. HbA1C: No results for input(s): HGBA1C in the last 72 hours. CBG: No results for input(s): GLUCAP in the last 168 hours. Lipid Profile: No results for input(s): CHOL, HDL, LDLCALC, TRIG, CHOLHDL, LDLDIRECT in the last 72 hours. Thyroid Function Tests: Recent Labs    09/30/18 1030  TSH 3.548   Anemia Panel: No results for input(s): VITAMINB12, FOLATE, FERRITIN, TIBC, IRON, RETICCTPCT in the last 72 hours. Sepsis Labs: No results for input(s): PROCALCITON, LATICACIDVEN in the last 168 hours.  Recent Results (from the past 240 hour(s))  Urine Culture     Status: None   Collection Time: 09/27/18  1:14 PM  Result Value Ref Range Status   Specimen Description   Final    URINE, CLEAN CATCH Performed at [facility], [address], [place], [place] [address]    Special Requests   Final    NONE Performed at [facility], [place] [address] [place], [place] [address]    Culture   Final    NO GROWTH Performed at [facility], [place] [address] [place], [place] [address]    Report Status 09/28/2018 FINAL  Final  Urine Culture     Status: Abnormal   Collection Time: 09/29/18 12:26 PM  Result Value Ref Range Status   Specimen Description   Final    URINE, RANDOM Performed at [facility] [address], [place], [place] [address]    Special Requests   Final    NONE Performed at [facility], [place] [address], [place], [place] [address]    Culture (A)  Final    <10,000 COLONIES/mL INSIGNIFICANT GROWTH Performed at [facility] [address] [place], [place] [address]    Report Status 09/30/2018 FINAL  Final         Radiology Studies: Dg Chest 2 View  Result Date: 10/01/2018 CLINICAL DATA:  Dyspnea. EXAM: CHEST - 2 VIEW COMPARISON:   Radiographs of September 28, 2018. FINDINGS: Stable cardiomediastinal silhouette. No pneumothorax or pleural effusion is noted. Both lungs are clear. The visualized skeletal structures are unremarkable. IMPRESSION:  No active cardiopulmonary disease. Electronically Signed   By: Marijo Conception, M.D.   On: 10/01/2018 21:05        Scheduled Meds: . amphetamine-dextroamphetamine  30 mg Oral BID  . doxycycline  100 mg Oral Q12H  . enoxaparin (LOVENOX) injection  40 mg Subcutaneous Q24H  . famotidine  20 mg Oral Daily  . feeding supplement (ENSURE ENLIVE)  237 mL Oral BID BM  . hydrALAZINE  25 mg Oral Q8H  . lactulose  20 g Oral BID  . letrozole  2.5 mg Oral Daily  . levothyroxine  150 mcg Oral Q0600  . palbociclib  125 mg Oral Q breakfast  . PARoxetine  40 mg Oral Daily  . sodium bicarbonate  650 mg Oral Daily   Continuous Infusions: . sodium chloride Stopped (10/02/18 1054)  . fluconazole (DIFLUCAN) IV 100 mg (10/02/18 1054)     LOS: 4 days    Time spent: 35 minutes.     Elmarie Shiley, MD [facility] Pager [phone]  If 7PM-7AM, please contact night-coverage [email or web address] Password [facility] 10/03/2018, 8:54 AM

## 2018-10-03 NOTE — Care Management Note (Signed)
Case Management Note  Patient Details  Name: Cheyenne Mckenzie MRN: [id number] Date of Birth: 12/07/1950  Subjective/Objective:  Pt Acute metabolic encephalopathy                  Action/Plan: Spoke with pt's sister Cheyenne Mckenzie concerning medications. There are no programs with the hospital for Letrozole. Encouraged sister to use [email or web address].  Pt's sister Cheyenne Mckenzie asked about pt using Hospice. Referred this to attending. Oncology to see pt. Will continue to follow.     Expected Discharge Date:  (unknown)               Expected Discharge Plan:     In-House Referral:     Discharge planning Services     Post Acute Care Choice:    Choice offered to:     DME Arranged:    DME Agency:     HH Arranged:    HH Agency:     Status of Service:     If discussed at [employer], dates discussed:    Additional CommentsPurcell Mouton, RN 10/03/2018, 2:28 PM

## 2018-10-03 NOTE — Progress Notes (Signed)
HEMATOLOGY-ONCOLOGY PROGRESS NOTE  SUBJECTIVE: Patient with metastatic breast cancer admitted with worsening symptoms of confusion.  Evaluation suggested that she may have metabolic encephalopathy from metastatic breast cancer. She was recently started on Ibrance with letrozole. When I spoke to her, she appears to understand what I was saying and she was also able to repeat the discussions.  She there are several words that are slurred but overall we can understand her speech.  She is able to move her extremities.  OBJECTIVE: REVIEW OF SYSTEMS:   Constitutional: Denies fevers, chills or abnormal weight loss Eyes: Denies blurriness of vision Ears, nose, mouth, throat, and face: Denies mucositis or sore throat Respiratory: Denies cough, dyspnea or wheezes Cardiovascular: Denies palpitation, chest discomfort Gastrointestinal:  Denies nausea, heartburn or change in bowel habits Skin: Denies abnormal skin rashes Lymphatics: Denies new lymphadenopathy or easy bruising Neurological: Confusion and some slurring of speech Behavioral/Psych: Mood is stable, no new changes  Extremities: No lower extremity edema All other systems were reviewed with the patient and are negative.  I have reviewed the past medical history, past surgical history, social history and family history with the patient and they are unchanged from previous note.   PHYSICAL EXAMINATION: ECOG PERFORMANCE STATUS: 3 - Symptomatic, >50% confined to bed  Vitals:   10/03/18 0640 10/03/18 1300  BP: (!) 167/108 (!) 152/110  Pulse: 86 95  Resp: 18 18  Temp: 98 F (36.7 C) 98.2 F (36.8 C)  SpO2: 97% 97%   Filed Weights   09/28/18 1910  Weight: 139 lb 10.9 oz (63.4 kg)    GENERAL:alert, no distress and comfortable SKIN: skin color, texture, turgor are normal, no rashes or significant lesions EYES: normal, Conjunctiva are pink and non-injected, sclera clear OROPHARYNX:no exudate, no erythema and lips, buccal mucosa, and  tongue normal  NECK: supple, thyroid normal size, non-tender, without nodularity LYMPH:  no palpable lymphadenopathy in the cervical, axillary or inguinal LUNGS: clear to auscultation and percussion with normal breathing effort HEART: regular rate & rhythm and no murmurs and no lower extremity edema ABDOMEN:abdomen soft, non-tender and normal bowel sounds Musculoskeletal:no cyanosis of digits and no clubbing  NEURO: Alert but not oriented.  Slurred speech, moves all extremities  LABORATORY DATA:  I have reviewed the data as listed CMP Latest Ref Rng & Units 10/03/2018 10/02/2018 10/01/2018  Glucose 70 - 99 mg/dL 83 80 92  BUN 8 - 23 mg/dL 19 16 20   Creatinine 0.44 - 1.00 mg/dL 1.64(H) 1.69(H) 1.86(H)  Sodium 135 - 145 mmol/L 140 140 135  Potassium 3.5 - 5.1 mmol/L 3.7 3.5 3.9  Chloride 98 - 111 mmol/L 114(H) 110 105  CO2 22 - 32 mmol/L 19(L) 20(L) 21(L)  Calcium 8.9 - 10.3 mg/dL 9.2 9.3 9.5  Total Protein 6.5 - 8.1 g/dL - 5.4(L) -  Total Bilirubin 0.3 - 1.2 mg/dL - 1.5(H) -  Alkaline Phos 38 - 126 U/L - 281(H) -  AST 15 - 41 U/L - 127(H) -  ALT 0 - 44 U/L - 47(H) -    Lab Results  Component Value Date   WBC 4.5 10/03/2018   HGB 9.9 (L) 10/03/2018   HCT 32.4 (L) 10/03/2018   MCV 101.9 (H) 10/03/2018   PLT 167 10/03/2018   NEUTROABS 3.1 09/28/2018    ASSESSMENT AND PLAN: Metastatic breast cancer with neurological symptoms concerning for encephalopathy. I discussed with the patient as well as spoke with her Lunette Stands. I do not believe aggressive treatments are warranted given her  advanced cancer. I also do not recommend extensive work-up for the slurred speech at this time. My recommendation is to discontinue Ibrance with letrozole.  Patient agreed with this decision.  [name] also was in agreement. We discussed different options and she agreed to hospice care. Please consult hospice to assist her with her end-of-life issues. Thank you very much for taking care of her.

## 2018-10-03 NOTE — Progress Notes (Signed)
Left upper extremity venous duplex has been completed. Negative for DVT.  10/03/18 9:27 AM Carlos Levering RVT

## 2018-10-04 LAB — CBC
HEMATOCRIT: 33.2 % — AB (ref 36.0–46.0)
Hemoglobin: 10 g/dL — ABNORMAL LOW (ref 12.0–15.0)
MCH: 30.4 pg (ref 26.0–34.0)
MCHC: 30.1 g/dL (ref 30.0–36.0)
MCV: 100.9 fL — ABNORMAL HIGH (ref 80.0–100.0)
NRBC: 0 % (ref 0.0–0.2)
PLATELETS: 172 10*3/uL (ref 150–400)
RBC: 3.29 MIL/uL — AB (ref 3.87–5.11)
RDW: 15.4 % (ref 11.5–15.5)
WBC: 4.7 10*3/uL (ref 4.0–10.5)

## 2018-10-04 LAB — BASIC METABOLIC PANEL
Anion gap: 11 (ref 5–15)
BUN: 20 mg/dL (ref 8–23)
CALCIUM: 9.4 mg/dL (ref 8.9–10.3)
CHLORIDE: 115 mmol/L — AB (ref 98–111)
CO2: 17 mmol/L — ABNORMAL LOW (ref 22–32)
CREATININE: 1.41 mg/dL — AB (ref 0.44–1.00)
GFR calc non Af Amer: 37 mL/min — ABNORMAL LOW (ref >60)
GFR, EST AFRICAN AMERICAN: 43 mL/min — AB (ref >60)
Glucose, Bld: 88 mg/dL (ref 70–99)
Potassium: 3.6 mmol/L (ref 3.5–5.1)
SODIUM: 143 mmol/L (ref 135–145)

## 2018-10-04 MED ORDER — MORPHINE SULFATE (PF) 2 MG/ML IV SOLN
1.0000 mg | INTRAVENOUS | Status: DC | PRN
  Administered 2018-10-04: 1 mg via INTRAVENOUS
  Filled 2018-10-04: qty 1

## 2018-10-04 MED ORDER — LACTATED RINGERS IV SOLN
INTRAVENOUS | Status: DC
  Administered 2018-10-04 (×2): via INTRAVENOUS

## 2018-10-04 NOTE — Progress Notes (Signed)
[facility]) hospital liaison note.  Received request from Maudry Diego for family interest in [facility]. Chart reviewed and spoke with sister, [name] to acknowledge referral. Unfortunately [facility] is not able to offer a room today. Family and CSW are aware [facility] liaison will follow up with CSW and family tomorrow or sooner if room becomes available.  Please do not hesitate to call with questions.   Thank you,   Farrel Gordon, RN, [facility] Liaison  [place] are on AMION

## 2018-10-04 NOTE — Progress Notes (Signed)
CSW received phone call from [facility]) stating that the facility will be able to offer a bed to patient tomorrow. [facility] stated that family is aware and is willing to meet with her at 4 pm today to go over paperwork for patient.   Rhea Pink, MSW,  [place]

## 2018-10-04 NOTE — Progress Notes (Signed)
PROGRESS NOTE  Cheyenne Mckenzie CNO:[id number] DOB: [birth year] DOA: 09/28/2018 PCP: Stephens Shire, MD  HPI/Recap of past 24 hours:  Oriented to self, calm,  Report right shoulder pain Sister in room  Assessment/Plan: Principal Problem:   Acute metabolic encephalopathy Active Problems:   ADD (attention deficit disorder)   Depression with anxiety   Hypothyroidism   Acute renal failure superimposed on stage 3 chronic kidney disease (HCC)   Hypercalcemia   Essential hypertension   Elevated liver function tests   Metastatic breast cancer ([place])   Pressure injury of skin  Acute metabolic and or Toxic encephalopathy;  CT head negative. 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  Presents with cr at 2.1. --1.9--1.8--1.6--1.6 Could be related to hypovolemia.  Continue with IV fluid.  Renal US; no hydronephrosis.  She is incontinent. Urine out put not accurate.  Improving. Continue with IV fluids.  Started  sodium bicarb.  Transition to full comfort measures and residential hospice placement  .   Hypercalcemia; malignancy   Ca 11.3 on presentation, ca normalized with  IV fluid.  Transition to full comfort measures and residential hospice placement  Transaminases; Secondary to metastasis diseases to liver.   Right side metastatic  Breast cancer, metastasis to liver,  Breast mass, open wound with mild amount of drainage. Wound care  Transition to full comfort measures and residential hospice placement  Anxiety, depression -Continue  Paxil -resume benzodiazepine Added  seroquel to help her sleep at night Transition to full comfort measures and residential hospice placement  ADD -Continue home Adderall Transition to full comfort measures and residential hospice placement  Hypothyroidism Patient is currently on Synthroid 137 mcg daily. TSH checked on October 30 was mildly elevated at 4.85;no dose change.  -Dose Synthroid to150 mcg daily.  -Transition to full comfort measures and residential hospice placement  Mild dyspnea; chest x ray negative  Odynophagia; started  diflucan   HTN -not on any home medications. -IV hydralazine 5 mg every 4 hours as needed forSBP >160 Schedule oral hydralazine.  Transition to full comfort measures and residential hospice placement  Left arm hematoma; doppler negative for DVT  Transition to full comfort measures and residential hospice placement  Code Status: DNR  Family Communication: patient   Disposition Plan: residential hospice when bed is available   Consultants:  Oncology  Neurology  Wound care  hospice  Procedures:  EEG  Antibiotics:  Oral doxycycline   Objective: BP (!) 142/86 (BP Location: Right Arm)   Pulse 96   Temp 97.6 F (36.4 C) (Oral)   Resp 18   Ht 5\' 8"  (1.727 m)   Wt 63.4 kg   SpO2 96%   BMI 21.24 kg/m   Intake/Output Summary (Last 24 hours) at 10/04/2018 1336 Last data filed at 10/04/2018 1100 Gross per 24 hour  Intake 490.38 ml  Output 600 ml  Net -109.62 ml   Filed Weights   09/28/18 1910  Weight: 63.4 kg    Exam: Patient is examined daily including today on 10/04/2018, exams remain the same as of yesterday except that has changed    General:  Alert,  but confused, only oriented to self  Cardiovascular: RRR  Respiratory: CTABL  Abdomen: Soft/ND/NT, positive BS  Musculoskeletal: No Edema  Neuro: Alert, but confused, only oriented to self  Data Reviewed: Basic Metabolic Panel: Recent Labs  Lab 09/30/18 0355 10/01/18 0452  10/02/18 0500 10/03/18 0931 10/04/18 0448  NA 133* 135 140 140 143  K 3.7 3.9 3.5 3.7 3.6  CL 105 105 110 114* 115*  CO2 18* 21* 20* 19* 17*  GLUCOSE 95 92 80 83 88  BUN 25* 20 16 19 20   CREATININE 1.98* 1.86* 1.69* 1.64* 1.41*  CALCIUM 9.6 9.5 9.3 9.2 9.4   Liver Function Tests: Recent Labs  Lab 09/28/18 2022 09/29/18 0620 09/30/18 0355 10/02/18 1100  AST 140* 134* 135* 127*  ALT 48* 47* 46* 47*  ALKPHOS 324* 340* 330* 281*  BILITOT 1.0 0.8 1.2 1.5*  PROT 6.7 5.7* 5.9* 5.4*  ALBUMIN 2.9* 2.5* 2.6* 2.4*   No results for input(s): LIPASE, AMYLASE in the last 168 hours. Recent Labs  Lab 09/28/18 2023 09/30/18 0355 10/02/18 1100  AMMONIA 38* 38* 26   CBC: Recent Labs  Lab 09/28/18 2022  09/30/18 0355 10/01/18 0452 10/02/18 0500 10/03/18 0931 10/04/18 0448  WBC 4.6   < > 4.9 6.1 4.6 4.5 4.7  NEUTROABS 3.1  --   --   --   --   --   --   HGB 12.9   < > 11.0* 11.5* 10.0* 9.9* 10.0*  HCT 42.7   < > 35.7* 37.4 32.7* 32.4* 33.2*  MCV 99.3   < > 99.4 100.0 99.4 101.9* 100.9*  PLT 199   < > 160 203 175 167 172   < > = values in this interval not displayed.   Cardiac Enzymes:   No results for input(s): CKTOTAL, CKMB, CKMBINDEX, TROPONINI in the last 168 hours. BNP (last 3 results) No results for input(s): BNP in the last 8760 hours.  ProBNP (last 3 results) No results for input(s): PROBNP in the last 8760 hours.  CBG: No results for input(s): GLUCAP in the last 168 hours.  Recent Results (from the past 240 hour(s))  Urine Culture     Status: None   Collection Time: 09/27/18  1:14 PM  Result Value Ref Range Status   Specimen Description   Final    URINE, CLEAN CATCH Performed at [facility], [address], [place], [place] [address]    Special Requests   Final    NONE Performed at [facility], [place] [address], [place], [place] [address]    Culture   Final    NO GROWTH Performed at [facility], [place] [address], [place], [place] [address]    Report Status 09/28/2018 FINAL  Final  Urine Culture     Status: Abnormal   Collection Time: 09/29/18 12:26 PM  Result Value Ref Range Status   Specimen Description   Final    URINE, RANDOM Performed at [facility] [address] [place], [place] [address]    Special Requests   Final    NONE Performed at [facility], [place] [address] [place], [place] [address]    Culture (A)  Final    <10,000 COLONIES/mL INSIGNIFICANT GROWTH Performed at [facility] [address] [place], [place] [address]    Report Status 09/30/2018 FINAL  Final     Studies: No results found.  Scheduled Meds: . amphetamine-dextroamphetamine  30 mg Oral BID  . enoxaparin (LOVENOX) injection  40 mg Subcutaneous Q24H  . famotidine  20 mg Oral Daily  . feeding supplement (ENSURE ENLIVE)  237 mL Oral BID BM  . hydrALAZINE  50 mg Oral Q8H  . lactulose  20 g Oral BID  . levothyroxine  150 mcg Oral Q0600  . PARoxetine  40 mg Oral Daily  . sodium bicarbonate  650 mg Oral Daily    Continuous Infusions: . lactated ringers       Time spent: 62mins I have personally reviewed and interpreted on  10/04/2018 daily labs,  imagings as discussed above under date review session and assessment and plans.  I reviewed all nursing notes, pharmacy notes, consultant notes,  vitals, pertinent old records  I have discussed plan of care as described above with RN , patient on 10/04/2018   Florencia Reasons MD, PhD  [facility] Pager [phone]. If 7PM-7AM, please contact night-coverage at [email or web address], password [facility] 10/04/2018, 1:36 PM  LOS: 5 days

## 2018-10-04 NOTE — Progress Notes (Signed)
[facility]) hospital liaison note.  Received request from Rhea Pink, [place] for family interest in [facility] with request for transfer  tomorrow. Chart reviewed and eligibilty. Met with sister, [name]  to confirm interest and explain services. Family agreeable to transfer tomorrow 10/05/2018. Rhea Pink, CSW aware.  Registration paper work completed. Dr. Orpah Melter  to assume care per family request.  Please fax discharge summary to [phone]. RN please call report to [phone]. Please arrange transport for patient to arrive on 10/05/2018.   Thank you,  Farrel Gordon, RN, [facility] Liaison  [place] are on AMION

## 2018-10-04 NOTE — Plan of Care (Signed)
  Problem: Health Behavior/Discharge Planning: Goal: Ability to manage health-related needs will improve Outcome: Progressing Note:  Pt awaiting inpt hospice bed.

## 2018-10-04 NOTE — Progress Notes (Signed)
Returned patient's girlfriend (leslie) call. [name] inquired about pt's status including that of PO intake. All information provided to girlfriend to her satisfaction. [name] was tearful as she did not want staff to feel like she did not care about pt since she was not present all the time. Reported the only reason why she was not present quite frequently was cuz she did not want pt to  think she came  to get her whenever she was in and then tried to pull IVs out and get OOB. Emotional support provided.

## 2018-10-04 NOTE — Progress Notes (Signed)
Clinical Social Worker received consult to assist patient and family in placing patient into a residential [facility]. CSW spoke with patients sister  [name] via phone. [name] stated she is patients health care POA but stated she also involves patients partner [name] in decision making. [name] stated she would like patient to go to a Residential Hospice facility in the [facility]. CSW explained to [name] that at this moment [facility] is full and CSW can make a referral but family will need to pick a second facility for patient to discharge too. [name] stated she will look at other Hospice facilities in the area and contact patients partner for decision making. CSW will contact [facility] for initial referral and await family second options.  Rhea Pink, MSW,  [place]

## 2018-10-05 NOTE — Plan of Care (Signed)
Pt had low intake this shift. Pt has a Purewick and has had good OP. Pt has a sitter in the room for safety. Pt compliant when instructed by sitter to stay in bed.

## 2018-10-05 NOTE — Telephone Encounter (Signed)
Oral Oncology Patient Advocate Encounter  Per Dr. Landis Gandy note on 10/03/18 the [facility] has been discontinued and the application has been stopped.   [place] Patient [place] Phone [phone] Fax [phone]

## 2018-10-05 NOTE — Discharge Summary (Addendum)
Discharge Summary  Cheyenne Mckenzie [facility]:[id number] DOB: 03-12-1951  PCP: Stephens Shire, MD  Admit date: 09/28/2018 Discharge date: 10/05/2018  Time spent: 4mns, more than 50% time spent on coordination of care.  Recommendations for Outpatient Follow-up:  1. [facility]   Discharge Diagnoses:  Active Hospital Problems   Diagnosis Date Noted  . Acute metabolic encephalopathy 167/89/3810 . Pressure injury of skin 09/30/2018  . Metastatic breast cancer ([facility]   . Hypothyroidism 09/20/2018  . Hypercalcemia 09/20/2018  . Depression with anxiety 09/20/2018  . Acute renal failure superimposed on stage 3 chronic kidney disease ([facility] 09/20/2018  . Essential hypertension   . ADD (attention deficit disorder)   . Elevated liver function tests     Resolved Hospital Problems  No resolved problems to display.    Discharge Condition: stable  Diet recommendation: comfort feeds  Filed Weights   09/28/18 1910  Weight: 63.4 kg    History of present illness: (per admitting MD Dr RMarlowe Sax PCP: BStephens Shire MD Patient coming from: Home  Chief Complaint: AMS  HPI: Cheyenne ARISTIZABALis a 67y.o. female with medical history significant of anxiety, ADD, depression, HTN, CKD 3 presenting to the hospital for evaluation of altered mental status.  Per sister and roommate at bedside patient has been confused since her previous hospitalization.  States her appetite is okay but she she has not been consuming enough fluid due to her confusion.  She has been sleeping all the time and having generalized weakness.  Per roommate, when patient speaks it sounds like a "word salad."  She has not had any fevers, chills, nausea, vomiting, or diarrhea.  They believe her urine appears dark.  States Dr. GArnoldo Lenisoffice advised them to bring the patient into the hospital due to abnormal labs.  Patient has not started taking her cancer pain medications yet.  Family  thinks her mental status has improved since she has received IV fluids in the ED.  Patient was recently admitted from September 19, 2018 to September 22, 2018.  CT chest revealed a right-sided breast mass which was thought to be likely breast cancer with liver mets, status post liver biopsy.  Patient was also encephalopathic and MRI brain did not show any acute findings.  Ammonia was only mildly elevated at 41.  Liver biopsy revealed metastatic carcinoma.  Patient was seen by Dr. GSonny Dandyon September 27, 2018.  ED Course: Afebrile and remainder of vitals stable on arrival.  CBC normal.  Creatinine 2.5, was 2.1 two days ago.  Corrected calcium 12.1; was 12.5 two days ago.  AST 140, ALT 48, alk phos 324, and T bili 1.0.  LFTs improved compared to labs done 2 days ago.  Ammonia mildly elevated at 38.  UA pending.  I-STAT troponin negative and EKG not suggestive of ACS.  Chest x-ray showing mild cardiomegaly and trace pleural effusions.  CT head showing no acute abnormality or evidence of metastatic disease to the brain. TRH paged to admit.   Hospital Course:  Principal Problem:   Acute metabolic encephalopathy Active Problems:   ADD (attention deficit disorder)   Depression with anxiety   Hypothyroidism   Acute renal failure superimposed on stage 3 chronic kidney disease (HCC)   Hypercalcemia   Essential hypertension   Elevated liver function tests   Metastatic breast cancer ([facility]   Pressure injury of skin   Acute metabolicand or Toxicencephalopathy;  CT head negative. 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  Presents with cr at 2.1. --1.9--1.8--1.6--1.6 Could be related to hypovolemia.  Renal US; no hydronephrosis.  She is incontinent. Urine out put not accurate.  Treated withsodium bicarb and ivf in the hospital Transition to full comfort measures and residential hospice placement  .  Hypercalcemia; malignancy  Ca 11.3 on presentation, ca normalized with  IV fluid.  Transition to full comfort measures and residential hospice placement  Transaminases;Secondary to metastasis diseases to liver.   Right side metastatic Breast cancer, metastasis to liver,  Breast mass, open woundwith mild amount of drainage. Wound care  Transition to full comfort measures and residential hospice placement  Anxiety, depression, ADD -was on adderall,paxil, xanax at home  Transition to full comfort measures and residential hospice placement   Hypothyroidism Was on  Synthroid 137 mcg daily. TSH checked on October 30 was mildly elevated at 4.85;no dose change.  -Dose Synthroid to150 mcg daily.  -Transition to full comfort measures and residential hospice placement  Mild dyspnea;chest x ray negative  Odynophagia; treated withdiflucan  In the hospital Transition to full comfort measures and residential hospice placement  HTN -not on any home medications. -IV hydralazine 5 mg every 4 hours as needed forSBP >160 Treated with Schedule oral hydralazine in the hospital Transition to full comfort measures and residential hospice placement  Left arm hematoma; doppler negative for DVT Transition to full comfort measures and residential hospice placement   RN Pressure Injury Documentation: Pressure Injury 09/29/18 Deep Tissue Injury - Purple or maroon localized area of discolored intact skin or blood-filled blister due to damage of underlying soft tissue from pressure and/or shear. (Active)  09/29/18 0837   Location: Heel  Location  Orientation: Right  Staging: Deep Tissue Injury - Purple or maroon localized area of discolored intact skin or blood-filled blister due to damage of underlying soft tissue from pressure and/or shear.  Wound Description (Comments):   Present on Admission: Yes     Code Status: DNR  Family Communication: patient   Disposition Plan: residential hospice    Consultants:  Oncology  Neurology  Wound care  hospice  Procedures:  EEG  Antibiotics:  Oral doxycycline    Discharge Exam: BP (!) 185/104   Pulse 88   Temp 98.1 F (36.7 C) (Oral)   Resp 18   Ht 5' 8"  (1.727 m)   Wt 63.4 kg   SpO2 97%   BMI 21.24 kg/m   General: NAD, pleasantly confused, she is only oriented to person Cardiovascular: RRR Respiratory: CTABL Right breast wound with dressing in place  Discharge Instructions You were cared for by a hospitalist during your hospital stay. If you have any questions about your discharge medications or the care you received while you were in the hospital after you are discharged, you can call the unit and asked to speak with the hospitalist on call if the hospitalist that took care of you is not available. Once you are discharged, your primary care physician will handle any further medical issues. Please note that NO REFILLS for any discharge medications will be authorized once you  are discharged, as it is imperative that you return to your primary care physician (or establish a relationship with a primary care physician if you do not have one) for your aftercare needs so that they can reassess your need for medications and monitor your lab values.  Discharge Instructions    Diet general   Complete by:  As directed    Increase activity slowly   Complete by:  As directed      Allergies as of 10/05/2018      Reactions   Erythromycin Base Nausea And Vomiting   Sulfamethoxazole Nausea And Vomiting   Can only take with food      Medication List    STOP  taking these medications   ALPRAZolam 0.5 MG tablet Commonly known as:  XANAX   amphetamine-dextroamphetamine 30 MG tablet Commonly known as:  ADDERALL   aspirin-acetaminophen-caffeine 250-250-65 MG tablet Commonly known as:  EXCEDRIN MIGRAINE   fentaNYL 25 MCG/HR patch Commonly known as:  DURAGESIC - dosed mcg/hr   letrozole 2.5 MG tablet Commonly known as:  FEMARA   levothyroxine 137 MCG tablet Commonly known as:  SYNTHROID, LEVOTHROID   oxyCODONE 5 MG immediate release tablet Commonly known as:  Oxy IR/ROXICODONE   palbociclib 125 MG capsule Commonly known as:  IBRANCE   PARoxetine 40 MG tablet Commonly known as:  PAXIL   senna-docusate 8.6-50 MG tablet Commonly known as:  Senokot-S      Allergies  Allergen Reactions  . Erythromycin Base Nausea And Vomiting  . Sulfamethoxazole Nausea And Vomiting    Can only take with food      The results of significant diagnostics from this hospitalization (including imaging, microbiology, ancillary and laboratory) are listed below for reference.    Significant Diagnostic Studies: Ct Abdomen Pelvis Wo Contrast  Result Date: 09/20/2018 CLINICAL DATA:  67 year old female with abdominal pain. Abnormal liver enzymes. EXAM: CT ABDOMEN AND PELVIS WITHOUT CONTRAST TECHNIQUE: Multidetector CT imaging of the abdomen and pelvis was performed following the standard protocol without IV contrast. COMPARISON:  None. FINDINGS: Evaluation of this exam is limited in the absence of intravenous contrast. Lower chest: There is a 5 mm right lower lobe pulmonary nodule. The visualized lung bases are otherwise clear. There is coronary vascular calcification. No intra-abdominal free air or free fluid. Hepatobiliary: Multiple (greater than 20) hepatic hypodense lesions most consistent with metastatic disease. Clinical correlation and further evaluation with MRI or tissue sampling recommended. No intrahepatic biliary ductal dilatation. The gallbladder is  unremarkable. Pancreas: Unremarkable. No pancreatic ductal dilatation or surrounding inflammatory changes. Spleen: Normal in size without focal abnormality. Adrenals/Urinary Tract: The adrenal glands are unremarkable. Mild bilateral renal parenchyma atrophy. A 4 mm vascular calcification versus less likely a nonobstructing right renal interpolar calculus. No hydronephrosis. There is no hydronephrosis or nephrolithiasis on the left. The visualized ureters and urinary bladder appear unremarkable. Stomach/Bowel: There is moderate stool within the colon. There is colonic diverticulosis without active inflammatory changes. There is a moderate size hiatal hernia. There is no bowel obstruction or active inflammation. Normal appendix. Vascular/Lymphatic: Moderate aortoiliac atherosclerotic disease. No portal venous gas. There is no adenopathy. Reproductive: The uterus is poorly visualized and may be atrophic or partially resected. No pelvic mass. Other: None Musculoskeletal: Osteopenia with scoliosis and degenerative changes of the spine. No acute osseous pathology. IMPRESSION: 1. Multiple hepatic hypodense lesions most consistent with metastatic disease. Clinical correlation and further evaluation with MRI or tissue sampling recommended. 2. Colonic diverticulosis. No bowel obstruction or active inflammation.  Normal appendix. 3. Moderate size hiatal hernia. Electronically Signed   By: Anner Crete M.D.   On: 09/20/2018 03:00   Dg Chest 2 View  Result Date: 10/01/2018 CLINICAL DATA:  Dyspnea. EXAM: CHEST - 2 VIEW COMPARISON:  Radiographs of September 28, 2018. FINDINGS: Stable cardiomediastinal silhouette. No pneumothorax or pleural effusion is noted. Both lungs are clear. The visualized skeletal structures are unremarkable. IMPRESSION: No active cardiopulmonary disease. Electronically Signed   By: Marijo Conception, M.D.   On: 10/01/2018 21:05   Dg Chest 2 View  Result Date: 09/28/2018 CLINICAL DATA:  Altered  mental status EXAM: CHEST - 2 VIEW COMPARISON:  09/20/2018 FINDINGS: Trace pleural effusions on the lateral view. Mild cardiomegaly. No acute consolidation. No pneumothorax. IMPRESSION: Cardiomegaly.  Trace pleural effusions. Electronically Signed   By: Donavan Foil M.D.   On: 09/28/2018 20:53   Dg Chest 2 View  Result Date: 09/20/2018 CLINICAL DATA:  67 year old female with weakness. EXAM: CHEST - 2 VIEW COMPARISON:  Chest radiograph dated 08/28/2009 FINDINGS: Mild eventration of the left hemidiaphragm with left lung base atelectasis. No focal consolidation, pleural effusion, or pneumothorax. Stable cardiac silhouette. Thoracic scoliosis. No acute osseous pathology. IMPRESSION: No active cardiopulmonary disease. Electronically Signed   By: Anner Crete M.D.   On: 09/20/2018 04:15   Ct Head Wo Contrast  Result Date: 09/28/2018 CLINICAL DATA:  Altered level of consciousness. Recent diagnosis of stage IV breast cancer. EXAM: CT HEAD WITHOUT CONTRAST TECHNIQUE: Contiguous axial images were obtained from the base of the skull through the vertex without intravenous contrast. COMPARISON:  MRI of the brain 09/20/2018. FINDINGS: Brain: Mild atrophy and white matter disease is present. No acute infarct or hemorrhage is present. There is no mass lesion. 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 Ventricles are proportionate to the degree of atrophy. No significant extra-axial fluid collection is present. Vascular: Atherosclerotic calcifications are present within the cavernous internal carotid arteries. There is no hyperdense vessel. Skull: Calvarium is intact. There is thinning of the scalp near the vertex. Sinuses/Orbits: The paranasal sinuses and mastoid air cells are clear. Globes and orbits are within normal limits. IMPRESSION: 1. No acute intracranial abnormality. 2. No evidence of metastatic disease the brain. 3. Stable mild atrophy and white matter disease. Electronically  Signed   By: San Morelle M.D.   On: 09/28/2018 20:55   Ct Chest Wo Contrast  Result Date: 09/20/2018 CLINICAL DATA:  Patient with known hepatic metastatic disease, unknown primary EXAM: CT CHEST WITHOUT CONTRAST TECHNIQUE: Multidetector CT imaging of the chest was performed following the standard protocol without IV contrast. COMPARISON:  Abdominal CT from earlier in the same day. FINDINGS: Cardiovascular: Limited due to lack of IV contrast. Diffuse atherosclerotic changes are identified. Tortuosity of the thoracic aorta is noted. Prominence of the ascending aorta 3.8 cm is noted. Coronary calcifications are seen. No significant cardiac enlargement is noted. Mediastinum/Nodes: Esophagus is within normal limits. The thoracic inlet is unremarkable. No significant hilar or mediastinal adenopathy is identified. A 10 mm lymph node is noted along the right inferior axilla as well as multiple lymph nodes within the right axilla. The largest of these measures approximately 10.7 mm in short axis. These are trouble some based on their multiplicity and asymmetry. Lungs/Pleura: Diffuse emphysematous changes are noted. A 5 mm right lower lobe nodule is noted best seen on image number 79 of series 5. Mild scarring is noted in the left lung base. No focal infiltrate or  effusion is seen. An ill-defined 6 mm nodule is noted within the left lower lobe. No other focal nodules are seen. No pneumothorax or effusion is noted. Upper Abdomen: Visualized upper abdomen again demonstrates multiple hypodense lesions scattered throughout the liver consistent with metastatic disease. Musculoskeletal: Degenerative changes of the thoracic spine are noted with associated scoliosis. No definitive rib abnormality is seen at this time. In the right breast in a retroareolar location, there is a 3.9 x 3.2 cm somewhat spiculated mass lesion with associated calcifications identified. This is highly suspicious for breast carcinoma given its  spiculated appearance as well as the apparent nipple retraction. Correlation with the physical exam is recommended. Mammography may be helpful as well with possible breast biopsy. IMPRESSION: Right breast mass with spiculation as described above. Associated axillary adenopathy is identified. These changes are seen consistent with a primary breast carcinoma till proven otherwise. Mammography and ultrasound evaluation may be helpful. Lung nodules in the lower lobes bilaterally. These are of uncertain significance and may be unrelated to the underlying breast abnormality and liver abnormality. Stable hepatic metastatic disease. Mildly prominent ascending thoracic aorta. Aortic Atherosclerosis (ICD10-I70.0) and Emphysema (ICD10-J43.9). Electronically Signed   By: Inez Catalina M.D.   On: 09/20/2018 13:53   Mr Brain Wo Contrast  Result Date: 09/20/2018 CLINICAL DATA:  Encephalopathy, confusion and general weakness. History of hypertension, liver metastasis. EXAM: MRI HEAD WITHOUT CONTRAST TECHNIQUE: Multiplanar, multiecho pulse sequences of the brain and surrounding structures were obtained without intravenous contrast. COMPARISON:  None. FINDINGS: Multiple sequences are moderately motion degraded. INTRACRANIAL CONTENTS: No reduced diffusion to suggest acute ischemia or hypercellular tumor. A few scattered faint punctate foci of susceptibility artifact, these could reflect chronic microhemorrhages or vessels. 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 Patchy supratentorial and pontine white matter FLAIR T2 hyperintensities. [place] T2 hyperintense signal bilateral basal ganglia and thalami associated with chronic small vessel ischemic changes/chronic hypertension. Mild parenchymal brain volume loss. No hydrocephalus. No suspicious parenchymal signal, masses, mass effect. No abnormal extra-axial fluid collections. No extra-axial masses. VASCULAR: Normal major intracranial vascular flow voids present at skull base. SKULL AND  UPPER CERVICAL SPINE: No abnormal sellar expansion. No suspicious calvarial bone marrow signal. Craniocervical junction maintained. Focal parietal scalp scarring. SINUSES/ORBITS: The mastoid air-cells and included paranasal sinuses are well-aerated.The included ocular globes and orbital contents are non-suspicious. OTHER: None. IMPRESSION: 1. No acute intracranial process or metastasis on this motion degraded noncontrast MRI head. 2. Mild parenchymal brain volume loss. 3. Mild chronic small vessel ischemic changes. Old RIGHT cerebellar infarct. Electronically Signed   By: Elon Alas M.D.   On: 09/20/2018 18:12   US Renal  Result Date: 09/29/2018 CLINICAL DATA:  Acute kidney failure. EXAM: RENAL / URINARY TRACT ULTRASOUND COMPLETE COMPARISON:  None. FINDINGS: Right Kidney: Renal measurements: 10.5 x 3.3 x 5.1 cm = volume: 90.9 mL . Echogenicity within normal limits. No mass or hydronephrosis visualized. There is generalized cortical thinning. Left Kidney: Renal measurements: 10.5 x 4.4 x 4.4 cm = volume: 106.3 mL. Echogenicity within normal limits. No hydronephrosis visualized. There is a 1 x 0.7 x 0.7 cm simple cyst in the upper pole left kidney. Bladder: Appears normal for degree of bladder distention. IMPRESSION: Generalized cortical thinning of the right kidney. No acute abnormalities identified in both kidneys. Electronically Signed   By: Abelardo Diesel M.D.   On: 09/29/2018 13:13   US Biopsy (liver)  Result Date: 09/22/2018 INDICATION: 67 year old female with clinical history of right breast carcinoma metastatic to the liver. She  presents today for liver biopsy, with pending mammographies workup. EXAM: ULTRASOUND-GUIDED BIOPSY LIVER MEDICATIONS: None. ANESTHESIA/SEDATION: Moderate (conscious) sedation was employed during this procedure. A total of Versed 1.0 mg and Fentanyl 50 mcg was administered intravenously. Moderate Sedation Time: 10 minutes. The patient's level of consciousness and vital  signs were monitored continuously by radiology nursing throughout the procedure under my direct supervision. FLUOROSCOPY TIME:  None COMPLICATIONS: None PROCEDURE: Informed written consent was obtained from the patient after a thorough discussion of the procedural risks, benefits and alternatives. All questions were addressed. Maximal Sterile Barrier Technique was utilized including caps, mask, sterile gowns, sterile gloves, sterile drape, hand hygiene and skin antiseptic. A timeout was performed prior to the initiation of the procedure. Patient positioned supine position. Ultrasound images of the liver were performed. The patient is prepped and draped in the usual sterile fashion. 1% lidocaine was used for local anesthesia. Ultrasound guidance was used to place needle into the right liver for targeting of heterogeneously hyperechoic liver lesion. Multiple 18 gauge core biopsy were acquired. Gel-Foam pledgets were infused. Needles removed and sterile bandage was placed. Patient tolerated the procedure well and remained hemodynamically stable throughout. No complications were encountered and no significant blood loss. IMPRESSION: Status post ultrasound-guided biopsy of right liver lobe lesion. Tissue specimen sent to pathology for complete histopathologic analysis. Signed, Dulcy Fanny. Dellia Nims, RPVI [facility] Electronically Signed   By: Corrie Mckusick D.O.   On: 09/22/2018 09:42   Vas [place] Upper Extremity Venous Duplex  Result Date: 10/03/2018 UPPER VENOUS STUDY  Indications: Edema Limitations: Patient movement, patient cooperation. Performing Technologist: Oliver Hum RVT  Examination Guidelines: A complete evaluation includes B-mode imaging, spectral Doppler, color Doppler, and power Doppler as needed of all accessible portions of each vessel. Bilateral testing is considered an integral part of a complete examination. Limited examinations for  reoccurring indications may be performed as noted.  Right Findings: +----------+------------+----------+---------+-----------+-------+ RIGHT     CompressiblePropertiesPhasicitySpontaneousSummary +----------+------------+----------+---------+-----------+-------+ Subclavian    Full                 Yes       Yes            +----------+------------+----------+---------+-----------+-------+  Left Findings: +----------+------------+----------+---------+-----------+-------+ LEFT      CompressiblePropertiesPhasicitySpontaneousSummary +----------+------------+----------+---------+-----------+-------+ IJV           Full                 Yes       Yes            +----------+------------+----------+---------+-----------+-------+ Subclavian    Full                 Yes       Yes            +----------+------------+----------+---------+-----------+-------+ Axillary      Full                 Yes       Yes            +----------+------------+----------+---------+-----------+-------+ Brachial      Full                 Yes       Yes            +----------+------------+----------+---------+-----------+-------+ Radial        Full                                          +----------+------------+----------+---------+-----------+-------+  Ulnar         Full                                          +----------+------------+----------+---------+-----------+-------+ Cephalic      Full                                          +----------+------------+----------+---------+-----------+-------+ Basilic       Full                                          +----------+------------+----------+---------+-----------+-------+  Summary:  Right: No evidence of thrombosis in the subclavian.  Left: No evidence of deep vein thrombosis in the upper extremity. No evidence of superficial vein thrombosis in the upper extremity.  *See table(s) above for measurements and observations.   Diagnosing physician: Deitra Mayo MD Electronically signed by Deitra Mayo MD on 10/03/2018 at 2:32:08 PM.    Final     Microbiology: Recent Results (from the past 240 hour(s))  Urine Culture     Status: None   Collection Time: 09/27/18  1:14 PM  Result Value Ref Range Status   Specimen Description   Final    URINE, CLEAN CATCH Performed at [facility], [address], [place], [place] [address]    Special Requests   Final    NONE Performed at [facility], [place] [address] [place], [place] [address]    Culture   Final    NO GROWTH Performed at [facility], [place] [address] [place], [place] [address]    Report Status 09/28/2018 FINAL  Final  Urine Culture     Status: Abnormal   Collection Time: 09/29/18 12:26 PM  Result Value Ref Range Status   Specimen Description   Final    URINE, RANDOM Performed at [facility] [address] [address] [place], [place] [address]    Special Requests   Final    NONE Performed at [facility], [place] [address] [place], [place] [address]    Culture (A)  Final    <10,000 COLONIES/mL INSIGNIFICANT GROWTH Performed at [facility] [address], [place], [place] [address]    Report Status 09/30/2018 FINAL  Final     Labs: Basic Metabolic Panel: Recent Labs  Lab 09/30/18 0355 10/01/18 0452 10/02/18 0500 10/03/18 0931 10/04/18 0448  NA 133* 135 140 140 143  K 3.7 3.9 3.5 3.7 3.6  CL 105 105 110 114* 115*  CO2 18* 21* 20* 19* 17*  GLUCOSE 95 92 80 83 88  BUN 25* 20 16 19 20   CREATININE 1.98* 1.86* 1.69* 1.64* 1.41*  CALCIUM 9.6 9.5 9.3 9.2 9.4   Liver Function Tests: Recent Labs  Lab 09/28/18 2022 09/29/18 0620 09/30/18 0355 10/02/18 1100  AST 140* 134* 135* 127*  ALT 48* 47* 46* 47*  ALKPHOS 324* 340* 330* 281*  BILITOT 1.0 0.8 1.2 1.5*  PROT 6.7 5.7* 5.9* 5.4*  ALBUMIN 2.9* 2.5* 2.6* 2.4*   No results for  input(s): LIPASE, AMYLASE in the last 168 hours. Recent Labs  Lab 09/28/18 2023 09/30/18 0355 10/02/18 1100  AMMONIA 38* 38* 26   CBC: Recent Labs  Lab 09/28/18 2022  09/30/18 0355 10/01/18 0452 10/02/18 0500 10/03/18 0931 10/04/18 0448  WBC 4.6   < > 4.9 6.1 4.6 4.5 4.7  NEUTROABS 3.1  --   --   --   --   --   --   HGB 12.9   < > 11.0* 11.5* 10.0* 9.9* 10.0*  HCT 42.7   < > 35.7* 37.4 32.7* 32.4* 33.2*  MCV 99.3   < > 99.4 100.0 99.4 101.9* 100.9*  PLT 199   < > 160 203 175 167 172   < > = values in this interval not displayed.   Cardiac Enzymes: No results for input(s): CKTOTAL, CKMB, CKMBINDEX, TROPONINI in the last 168 hours. BNP: BNP (last 3 results) No results for input(s): BNP in the last 8760 hours.  ProBNP (last 3 results) No results for input(s): PROBNP in the last 8760 hours.  CBG: No results for input(s): GLUCAP in the last 168 hours.     Signed:  Florencia Reasons MD, PhD  [facility] 10/05/2018, 1:06 PM

## 2018-10-05 NOTE — Progress Notes (Signed)
Clinical Social Worker facilitated patient discharge including contacting patient family and facility to confirm patient discharge plans.  Clinical information faxed to facility and family agreeable with plan.  CSW arranged ambulance transport via [facility] to [facility] .  RN to call [phone]  report prior to discharge.  Clinical Social Worker will sign off for now as social work intervention is no longer needed. Please consult [place] again if new need arises.  Rhea Pink, MSW, [place]

## 2018-10-05 NOTE — Care Management Important Message (Signed)
Important Message  Patient Details  Name: Cheyenne Mckenzie MRN: [id number] Date of Birth: [birth year]   Medicare Important Message Given:  Yes    Kerin Salen 10/05/2018, 12:07 [place] Message  Patient Details  Name: Cheyenne Mckenzie MRN: [id number] Date of Birth: Sep 19, 1951   Medicare Important Message Given:  Yes    Kerin Salen 10/05/2018, 12:07 PM

## 2018-10-16 ENCOUNTER — Ambulatory Visit: Payer: Medicare Other | Admitting: Hematology and Oncology

## 2018-10-16 ENCOUNTER — Other Ambulatory Visit: Payer: Medicare Other

## 2018-10-22 DEATH — deceased

## 2020-02-17 IMAGING — CT CT ABD-PELV W/O CM
2 of 4 series · 15 of 46 positions shown, 17 images · non-contrast
Comparison: None.

CLINICAL DATA: 67-year-old female with abdominal pain. Abnormal
liver enzymes.

EXAM:
CT ABDOMEN AND PELVIS WITHOUT CONTRAST
TECHNIQUE: Multidetector CT imaging of the abdomen and pelvis was performed
following the standard protocol without IV contrast.

[Series 2: axial st · axial · 0.67mm/px · z∈[+769,+1174]mm · 12 of 91 slices shown, 14 images]
[im 5/91  soft-tissue]
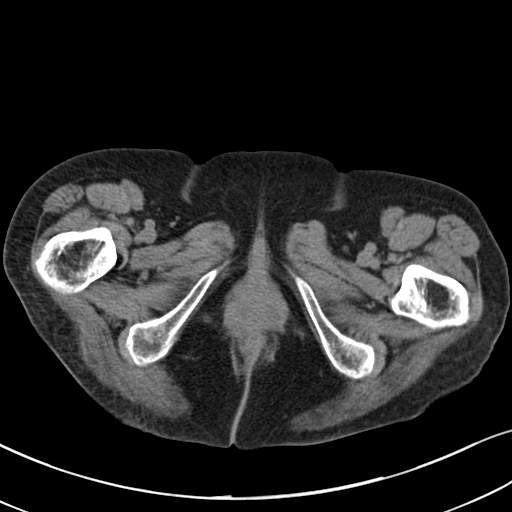
[im 5/91  bone]
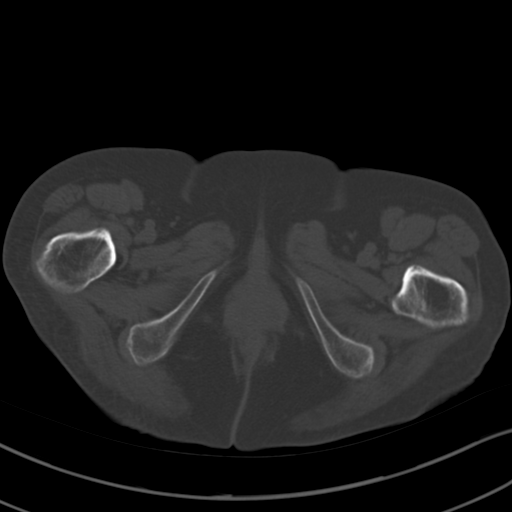
[im 15/91  soft-tissue]
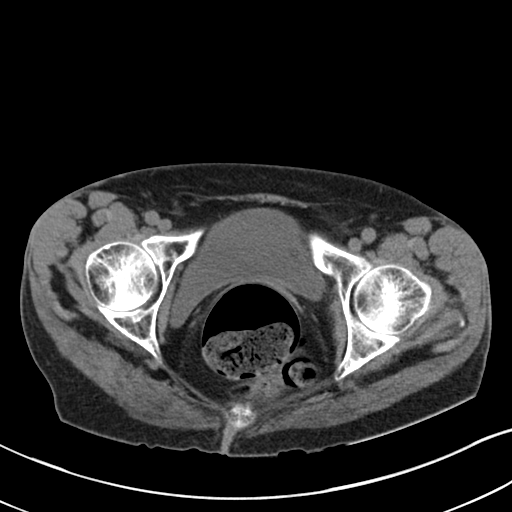
[im 19/91  soft-tissue]
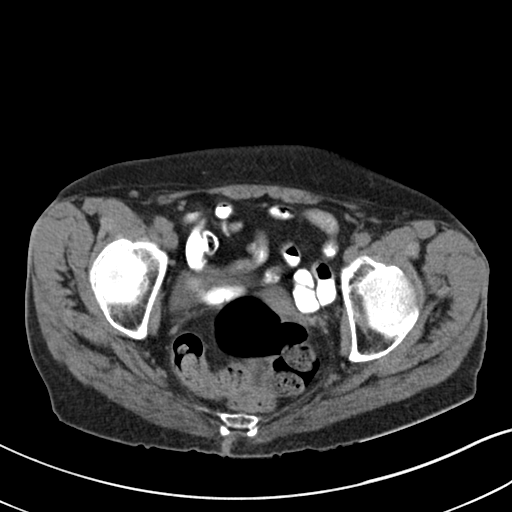
[im 29/91  soft-tissue]
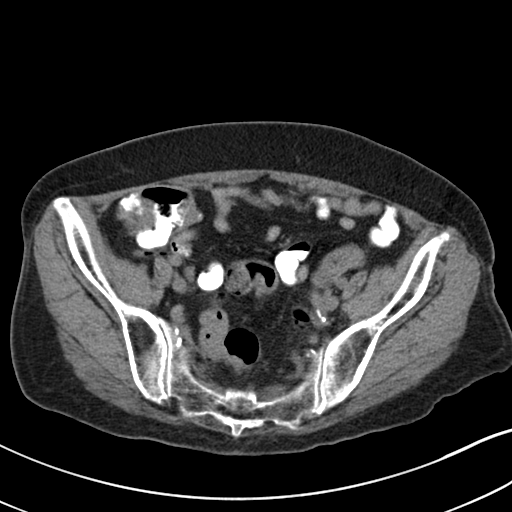
[im 34/91  soft-tissue]
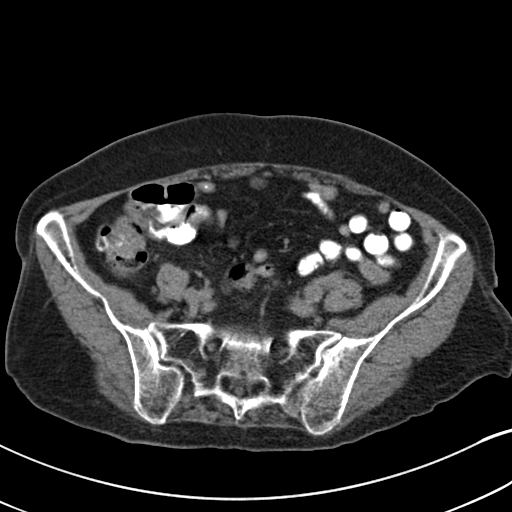
[im 43/91  soft-tissue]
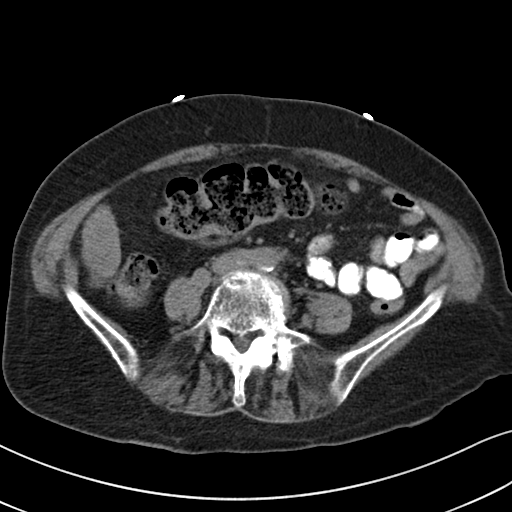
[im 48/91  soft-tissue]
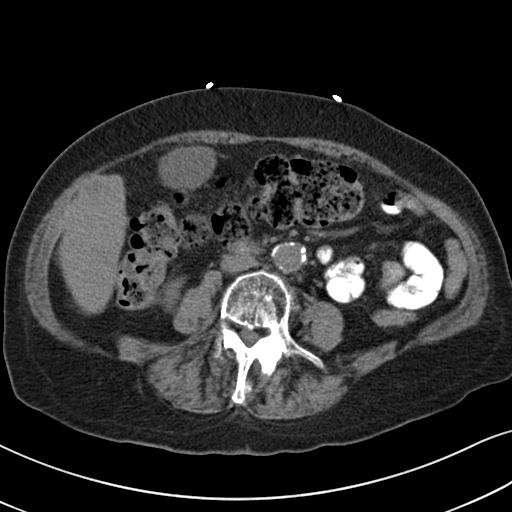
[im 57/91  soft-tissue]
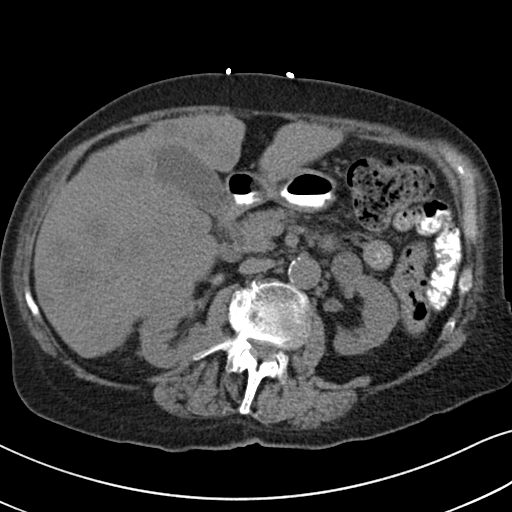
[im 62/91  soft-tissue]
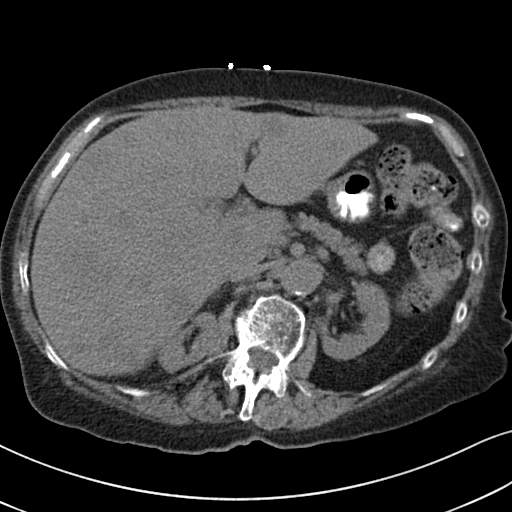
[im 62/91  bone]
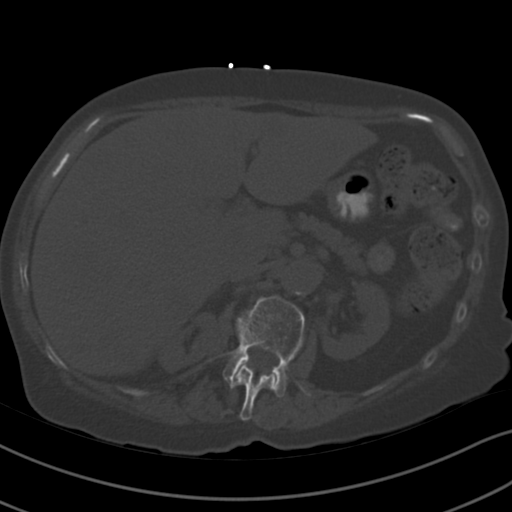
[im 72/91  soft-tissue]
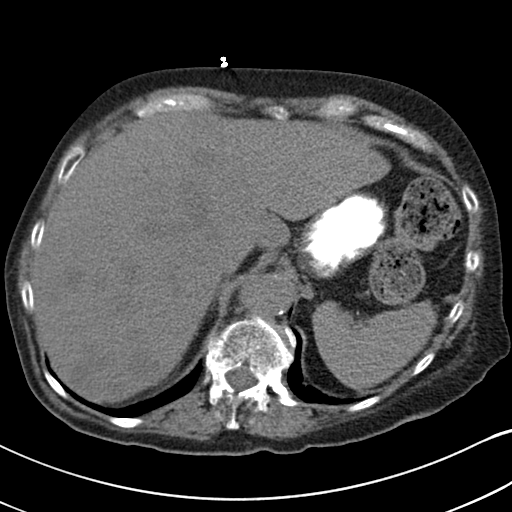
[im 76/91  soft-tissue]
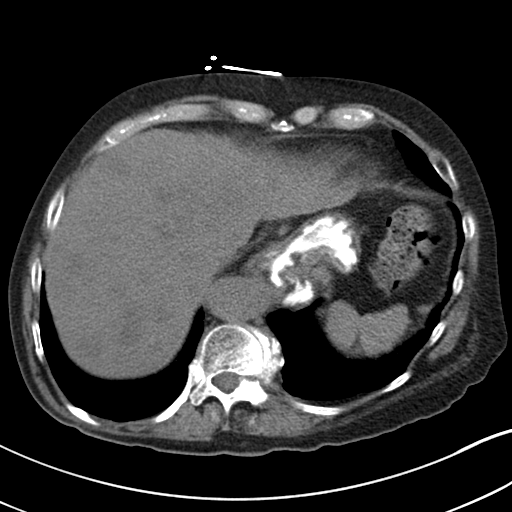
[im 86/91  soft-tissue]
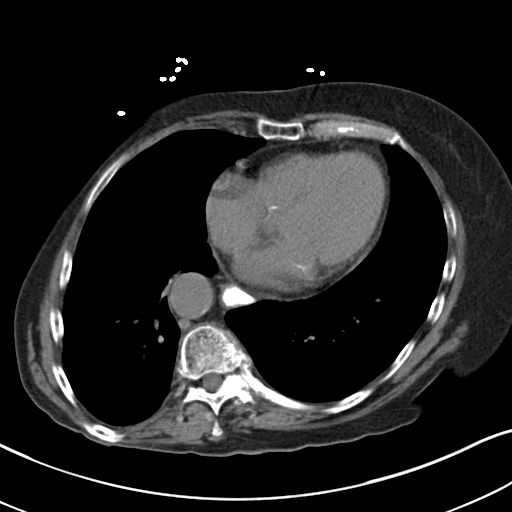

[Series 5: coronal st · coronal · 0.69mm/px · 3 of 88 slices shown]
[im 30/88  soft-tissue]
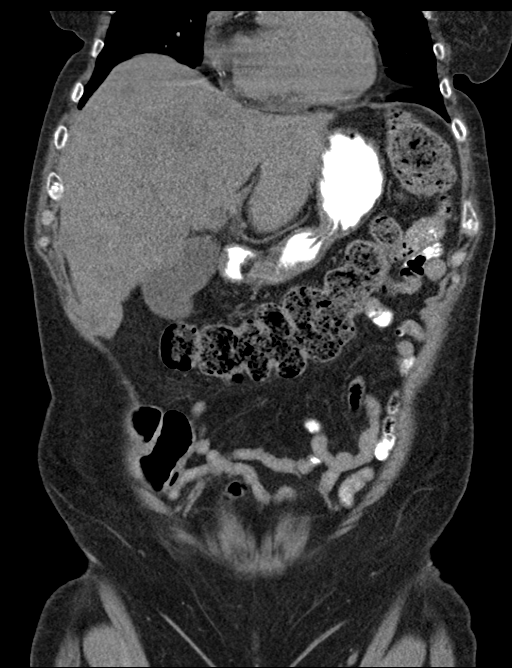
[im 39/88  soft-tissue]
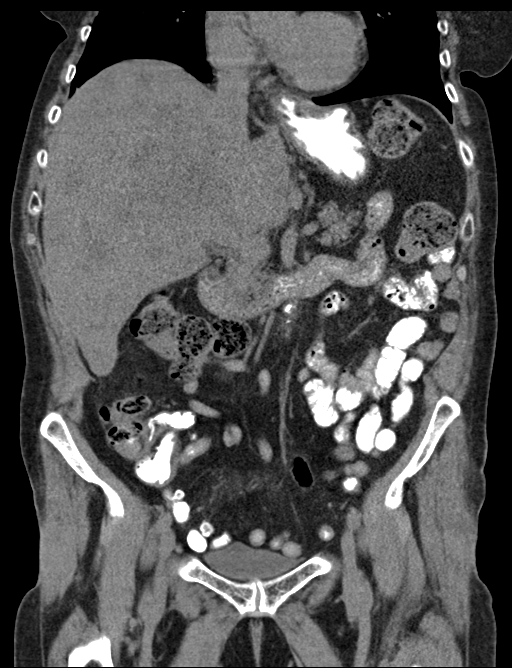
[im 49/88  soft-tissue]
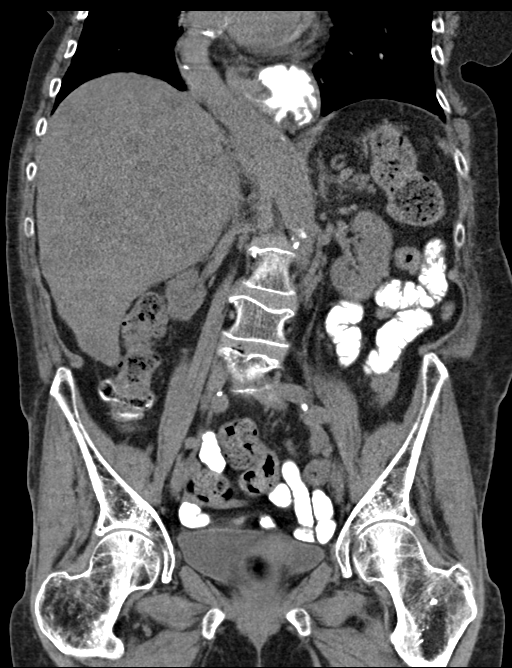

[15 of 46 positions shown; findings below may reference images not displayed]

FINDINGS: Evaluation of this exam is limited in the absence of intravenous
contrast.

Lower chest: There is a 5 mm right lower lobe pulmonary nodule. The
visualized lung bases are otherwise clear. There is coronary
vascular calcification.

No intra-abdominal free air or free fluid.

Hepatobiliary: Multiple (greater than 20) hepatic hypodense lesions
most consistent with metastatic disease. Clinical correlation and
further evaluation with MRI or tissue sampling recommended. No
intrahepatic biliary ductal dilatation. The gallbladder is
unremarkable.

Pancreas: Unremarkable. No pancreatic ductal dilatation or
surrounding inflammatory changes.

Spleen: Normal in size without focal abnormality.

Adrenals/Urinary Tract: The adrenal glands are unremarkable. Mild
bilateral renal parenchyma atrophy. A 4 mm vascular calcification
versus less likely a nonobstructing right renal interpolar calculus.
No hydronephrosis. There is no hydronephrosis or nephrolithiasis on
the left. The visualized ureters and urinary bladder appear
unremarkable.

Stomach/Bowel: There is moderate stool within the colon. There is
colonic diverticulosis without active inflammatory changes. There is
a moderate size hiatal hernia. There is no bowel obstruction or
active inflammation. Normal appendix.

Vascular/Lymphatic: Moderate aortoiliac atherosclerotic disease. No
portal venous gas. There is no adenopathy.

Reproductive: The uterus is poorly visualized and may be atrophic or
partially resected. No pelvic mass.

Other: None

Musculoskeletal: Osteopenia with scoliosis and degenerative changes
of the spine. No acute osseous pathology.
IMPRESSION: 1. Multiple hepatic hypodense lesions most consistent with
metastatic disease. Clinical correlation and further evaluation with
MRI or tissue sampling recommended.
2. Colonic diverticulosis. No bowel obstruction or active
inflammation. Normal appendix.
3. Moderate size hiatal hernia.
